# Patient Record
Sex: Female | Born: 1945 | Race: White | Hispanic: No | Smoking: Current every day smoker
Health system: Southern US, Community
[De-identification: ages and names within clinical notes are randomized; demographics above are authoritative.]

## PROBLEM LIST (undated history)

## (undated) DIAGNOSIS — D473 Essential (hemorrhagic) thrombocythemia: Secondary | ICD-10-CM

## (undated) DIAGNOSIS — Z1231 Encounter for screening mammogram for malignant neoplasm of breast: Principal | ICD-10-CM

## (undated) DIAGNOSIS — D759 Disease of blood and blood-forming organs, unspecified: Secondary | ICD-10-CM

## (undated) HISTORY — DX: Encounter for screening mammogram for malignant neoplasm of breast: Z12.31

## (undated) HISTORY — DX: Essential (hemorrhagic) thrombocythemia: D47.3

## (undated) HISTORY — PX: BREAST EXCISIONAL BIOPSY: SUR124

---

## 2003-12-16 ENCOUNTER — Ambulatory Visit: Payer: Self-pay | Admitting: Internal Medicine

## 2004-01-16 ENCOUNTER — Ambulatory Visit: Payer: Self-pay | Admitting: Internal Medicine

## 2004-01-26 ENCOUNTER — Ambulatory Visit: Payer: Self-pay | Admitting: Family Medicine

## 2004-02-15 ENCOUNTER — Ambulatory Visit: Payer: Self-pay | Admitting: Internal Medicine

## 2004-03-17 ENCOUNTER — Ambulatory Visit: Payer: Self-pay | Admitting: Internal Medicine

## 2004-04-17 ENCOUNTER — Ambulatory Visit: Payer: Self-pay | Admitting: Internal Medicine

## 2004-06-05 ENCOUNTER — Ambulatory Visit: Payer: Self-pay | Admitting: Internal Medicine

## 2004-06-15 ENCOUNTER — Ambulatory Visit: Payer: Self-pay | Admitting: Internal Medicine

## 2004-07-18 ENCOUNTER — Ambulatory Visit: Payer: Self-pay | Admitting: Family Medicine

## 2004-07-31 ENCOUNTER — Ambulatory Visit: Payer: Self-pay | Admitting: Internal Medicine

## 2004-08-15 ENCOUNTER — Ambulatory Visit: Payer: Self-pay | Admitting: Internal Medicine

## 2004-09-25 ENCOUNTER — Ambulatory Visit: Payer: Self-pay | Admitting: Internal Medicine

## 2004-10-15 ENCOUNTER — Ambulatory Visit: Payer: Self-pay | Admitting: Internal Medicine

## 2004-11-20 ENCOUNTER — Ambulatory Visit: Payer: Self-pay | Admitting: Internal Medicine

## 2004-12-15 ENCOUNTER — Ambulatory Visit: Payer: Self-pay | Admitting: Internal Medicine

## 2005-01-15 ENCOUNTER — Ambulatory Visit: Payer: Self-pay | Admitting: Internal Medicine

## 2005-02-14 ENCOUNTER — Ambulatory Visit: Payer: Self-pay | Admitting: Internal Medicine

## 2005-03-20 ENCOUNTER — Ambulatory Visit: Payer: Self-pay | Admitting: Internal Medicine

## 2005-04-17 ENCOUNTER — Ambulatory Visit: Payer: Self-pay | Admitting: Internal Medicine

## 2005-05-15 ENCOUNTER — Ambulatory Visit: Payer: Self-pay | Admitting: Internal Medicine

## 2005-06-15 ENCOUNTER — Ambulatory Visit: Payer: Self-pay | Admitting: Internal Medicine

## 2005-07-15 ENCOUNTER — Ambulatory Visit: Payer: Self-pay | Admitting: Internal Medicine

## 2005-08-06 ENCOUNTER — Ambulatory Visit: Payer: Self-pay | Admitting: Unknown Physician Specialty

## 2005-09-03 ENCOUNTER — Ambulatory Visit: Payer: Self-pay | Admitting: Internal Medicine

## 2005-09-14 ENCOUNTER — Ambulatory Visit: Payer: Self-pay | Admitting: Internal Medicine

## 2005-11-06 ENCOUNTER — Ambulatory Visit: Payer: Self-pay | Admitting: Internal Medicine

## 2005-11-15 ENCOUNTER — Ambulatory Visit: Payer: Self-pay | Admitting: Internal Medicine

## 2006-01-08 ENCOUNTER — Ambulatory Visit: Payer: Self-pay | Admitting: Internal Medicine

## 2006-01-15 ENCOUNTER — Ambulatory Visit: Payer: Self-pay | Admitting: Internal Medicine

## 2006-03-05 ENCOUNTER — Ambulatory Visit: Payer: Self-pay | Admitting: Internal Medicine

## 2006-03-17 ENCOUNTER — Ambulatory Visit: Payer: Self-pay | Admitting: Internal Medicine

## 2006-05-07 ENCOUNTER — Ambulatory Visit: Payer: Self-pay | Admitting: Internal Medicine

## 2006-05-16 ENCOUNTER — Ambulatory Visit: Payer: Self-pay | Admitting: Internal Medicine

## 2006-07-09 ENCOUNTER — Ambulatory Visit: Payer: Self-pay | Admitting: Internal Medicine

## 2006-07-16 ENCOUNTER — Ambulatory Visit: Payer: Self-pay | Admitting: Internal Medicine

## 2006-09-03 ENCOUNTER — Ambulatory Visit: Payer: Self-pay | Admitting: Internal Medicine

## 2006-09-08 ENCOUNTER — Ambulatory Visit: Payer: Self-pay | Admitting: Unknown Physician Specialty

## 2006-09-15 ENCOUNTER — Ambulatory Visit: Payer: Self-pay | Admitting: Internal Medicine

## 2006-10-16 ENCOUNTER — Ambulatory Visit: Payer: Self-pay | Admitting: Internal Medicine

## 2006-11-16 ENCOUNTER — Ambulatory Visit: Payer: Self-pay | Admitting: Internal Medicine

## 2006-12-16 ENCOUNTER — Ambulatory Visit: Payer: Self-pay | Admitting: Internal Medicine

## 2007-01-16 ENCOUNTER — Ambulatory Visit: Payer: Self-pay | Admitting: Internal Medicine

## 2007-03-04 ENCOUNTER — Ambulatory Visit: Payer: Self-pay | Admitting: Oncology

## 2007-03-18 ENCOUNTER — Ambulatory Visit: Payer: Self-pay | Admitting: Oncology

## 2007-04-18 ENCOUNTER — Ambulatory Visit: Payer: Self-pay | Admitting: Internal Medicine

## 2007-05-03 ENCOUNTER — Ambulatory Visit: Payer: Self-pay | Admitting: Internal Medicine

## 2007-05-16 ENCOUNTER — Ambulatory Visit: Payer: Self-pay | Admitting: Internal Medicine

## 2007-06-16 ENCOUNTER — Ambulatory Visit: Payer: Self-pay | Admitting: Internal Medicine

## 2007-07-16 ENCOUNTER — Ambulatory Visit: Payer: Self-pay | Admitting: Internal Medicine

## 2007-09-07 ENCOUNTER — Ambulatory Visit: Payer: Self-pay | Admitting: Internal Medicine

## 2007-09-15 ENCOUNTER — Ambulatory Visit: Payer: Self-pay | Admitting: Internal Medicine

## 2007-10-05 ENCOUNTER — Ambulatory Visit: Payer: Self-pay | Admitting: Unknown Physician Specialty

## 2007-10-16 ENCOUNTER — Ambulatory Visit: Payer: Self-pay | Admitting: Internal Medicine

## 2007-11-09 ENCOUNTER — Ambulatory Visit: Payer: Self-pay | Admitting: Internal Medicine

## 2007-11-16 ENCOUNTER — Ambulatory Visit: Payer: Self-pay | Admitting: Internal Medicine

## 2007-12-16 ENCOUNTER — Ambulatory Visit: Payer: Self-pay | Admitting: Internal Medicine

## 2008-01-16 ENCOUNTER — Ambulatory Visit: Payer: Self-pay | Admitting: Internal Medicine

## 2008-03-13 ENCOUNTER — Ambulatory Visit: Payer: Self-pay | Admitting: Internal Medicine

## 2008-03-17 ENCOUNTER — Ambulatory Visit: Payer: Self-pay | Admitting: Internal Medicine

## 2008-04-17 ENCOUNTER — Ambulatory Visit: Payer: Self-pay | Admitting: Internal Medicine

## 2008-05-15 ENCOUNTER — Ambulatory Visit: Payer: Self-pay | Admitting: Internal Medicine

## 2008-05-18 ENCOUNTER — Ambulatory Visit: Payer: Self-pay | Admitting: Internal Medicine

## 2008-06-15 ENCOUNTER — Ambulatory Visit: Payer: Self-pay | Admitting: Internal Medicine

## 2008-07-18 ENCOUNTER — Ambulatory Visit: Payer: Self-pay | Admitting: Internal Medicine

## 2008-08-15 ENCOUNTER — Ambulatory Visit: Payer: Self-pay | Admitting: Internal Medicine

## 2008-09-19 ENCOUNTER — Ambulatory Visit: Payer: Self-pay | Admitting: Internal Medicine

## 2008-10-15 ENCOUNTER — Ambulatory Visit: Payer: Self-pay | Admitting: Internal Medicine

## 2008-10-24 ENCOUNTER — Ambulatory Visit: Payer: Self-pay | Admitting: Unknown Physician Specialty

## 2008-10-25 ENCOUNTER — Ambulatory Visit: Payer: Self-pay

## 2008-11-15 ENCOUNTER — Ambulatory Visit: Payer: Self-pay | Admitting: Internal Medicine

## 2008-11-21 ENCOUNTER — Ambulatory Visit: Payer: Self-pay | Admitting: Internal Medicine

## 2008-12-15 ENCOUNTER — Ambulatory Visit: Payer: Self-pay | Admitting: Internal Medicine

## 2009-01-22 ENCOUNTER — Ambulatory Visit: Payer: Self-pay | Admitting: Internal Medicine

## 2009-02-14 ENCOUNTER — Ambulatory Visit: Payer: Self-pay | Admitting: Internal Medicine

## 2009-04-03 ENCOUNTER — Ambulatory Visit: Payer: Self-pay | Admitting: Internal Medicine

## 2009-04-17 ENCOUNTER — Ambulatory Visit: Payer: Self-pay | Admitting: Internal Medicine

## 2009-05-15 ENCOUNTER — Ambulatory Visit: Payer: Self-pay | Admitting: Internal Medicine

## 2009-05-28 ENCOUNTER — Ambulatory Visit: Payer: Self-pay | Admitting: Internal Medicine

## 2009-06-15 ENCOUNTER — Ambulatory Visit: Payer: Self-pay | Admitting: Internal Medicine

## 2009-08-28 ENCOUNTER — Ambulatory Visit: Payer: Self-pay | Admitting: Internal Medicine

## 2009-09-14 ENCOUNTER — Ambulatory Visit: Payer: Self-pay | Admitting: Internal Medicine

## 2009-11-15 ENCOUNTER — Ambulatory Visit: Payer: Self-pay | Admitting: Internal Medicine

## 2009-11-29 ENCOUNTER — Ambulatory Visit: Payer: Self-pay | Admitting: Internal Medicine

## 2009-12-15 ENCOUNTER — Ambulatory Visit: Payer: Self-pay | Admitting: Internal Medicine

## 2010-01-15 ENCOUNTER — Ambulatory Visit: Payer: Self-pay | Admitting: Internal Medicine

## 2010-03-01 ENCOUNTER — Ambulatory Visit: Payer: Self-pay | Admitting: Internal Medicine

## 2010-03-17 ENCOUNTER — Ambulatory Visit: Payer: Self-pay | Admitting: Internal Medicine

## 2010-05-31 ENCOUNTER — Ambulatory Visit: Payer: Self-pay | Admitting: Internal Medicine

## 2010-06-16 ENCOUNTER — Ambulatory Visit: Payer: Self-pay | Admitting: Internal Medicine

## 2010-09-02 ENCOUNTER — Ambulatory Visit: Payer: Self-pay | Admitting: Internal Medicine

## 2010-09-15 ENCOUNTER — Ambulatory Visit: Payer: Self-pay | Admitting: Internal Medicine

## 2010-12-02 ENCOUNTER — Ambulatory Visit: Payer: Self-pay | Admitting: Internal Medicine

## 2010-12-16 ENCOUNTER — Ambulatory Visit: Payer: Self-pay | Admitting: Internal Medicine

## 2011-02-03 ENCOUNTER — Ambulatory Visit: Payer: Self-pay | Admitting: Internal Medicine

## 2011-02-10 ENCOUNTER — Ambulatory Visit: Payer: Self-pay | Admitting: Internal Medicine

## 2011-02-15 ENCOUNTER — Ambulatory Visit: Payer: Self-pay | Admitting: Internal Medicine

## 2011-05-21 ENCOUNTER — Ambulatory Visit: Payer: Self-pay | Admitting: Internal Medicine

## 2011-05-21 LAB — CBC CANCER CENTER
Basophil #: 0.1 x10 3/mm (ref 0.0–0.1)
Eosinophil #: 0.1 x10 3/mm (ref 0.0–0.7)
HCT: 39.8 % (ref 35.0–47.0)
Lymphocyte #: 1.6 x10 3/mm (ref 1.0–3.6)
MCH: 36.1 pg — ABNORMAL HIGH (ref 26.0–34.0)
MCV: 105 fL — ABNORMAL HIGH (ref 80–100)
Monocyte #: 0.8 x10 3/mm — ABNORMAL HIGH (ref 0.0–0.7)
Monocyte %: 9.8 %
Neutrophil #: 5.9 x10 3/mm (ref 1.4–6.5)
Platelet: 325 x10 3/mm (ref 150–440)
RDW: 12.8 % (ref 11.5–14.5)
WBC: 8.4 x10 3/mm (ref 3.6–11.0)

## 2011-06-16 ENCOUNTER — Ambulatory Visit: Payer: Self-pay | Admitting: Internal Medicine

## 2011-08-21 ENCOUNTER — Ambulatory Visit: Payer: Self-pay | Admitting: Internal Medicine

## 2011-08-21 LAB — CBC CANCER CENTER
Basophil #: 0.1 x10 3/mm (ref 0.0–0.1)
Basophil %: 1 %
Eosinophil #: 0.1 x10 3/mm (ref 0.0–0.7)
HCT: 40.7 % (ref 35.0–47.0)
HGB: 13.7 g/dL (ref 12.0–16.0)
Lymphocyte %: 22.8 %
MCH: 35.8 pg — ABNORMAL HIGH (ref 26.0–34.0)
MCHC: 33.7 g/dL (ref 32.0–36.0)
MCV: 106 fL — ABNORMAL HIGH (ref 80–100)
Monocyte #: 0.7 x10 3/mm (ref 0.2–0.9)
Monocyte %: 9.3 %
Neutrophil #: 4.8 x10 3/mm (ref 1.4–6.5)
Platelet: 302 x10 3/mm (ref 150–440)
RBC: 3.84 10*6/uL (ref 3.80–5.20)
RDW: 13.4 % (ref 11.5–14.5)
WBC: 7.4 x10 3/mm (ref 3.6–11.0)

## 2011-09-08 LAB — CBC CANCER CENTER
Basophil #: 0.1 x10 3/mm (ref 0.0–0.1)
Eosinophil %: 1.2 %
HCT: 40.5 % (ref 35.0–47.0)
Lymphocyte %: 19.8 %
MCH: 35.5 pg — ABNORMAL HIGH (ref 26.0–34.0)
MCHC: 33.5 g/dL (ref 32.0–36.0)
MCV: 106 fL — ABNORMAL HIGH (ref 80–100)
Monocyte #: 0.8 x10 3/mm (ref 0.2–0.9)
Neutrophil %: 67.3 %
RBC: 3.82 10*6/uL (ref 3.80–5.20)
WBC: 7.7 x10 3/mm (ref 3.6–11.0)

## 2011-09-15 ENCOUNTER — Ambulatory Visit: Payer: Self-pay | Admitting: Internal Medicine

## 2011-09-25 LAB — CBC CANCER CENTER
Basophil #: 0.1 x10 3/mm (ref 0.0–0.1)
Eosinophil #: 0.2 x10 3/mm (ref 0.0–0.7)
Eosinophil %: 2 %
Lymphocyte #: 1.9 x10 3/mm (ref 1.0–3.6)
Lymphocyte %: 24.5 %
MCV: 106 fL — ABNORMAL HIGH (ref 80–100)
Monocyte #: 0.9 x10 3/mm (ref 0.2–0.9)
Monocyte %: 11.1 %
Neutrophil %: 61.3 %
Platelet: 300 x10 3/mm (ref 150–440)
RBC: 3.78 10*6/uL — ABNORMAL LOW (ref 3.80–5.20)
RDW: 12.7 % (ref 11.5–14.5)
WBC: 7.7 x10 3/mm (ref 3.6–11.0)

## 2011-10-16 ENCOUNTER — Ambulatory Visit: Payer: Self-pay | Admitting: Internal Medicine

## 2011-10-16 LAB — CBC CANCER CENTER
Basophil %: 1 %
Eosinophil %: 1.9 %
HCT: 39.3 % (ref 35.0–47.0)
HGB: 13.7 g/dL (ref 12.0–16.0)
Lymphocyte %: 24.7 %
MCH: 36.9 pg — ABNORMAL HIGH (ref 26.0–34.0)
MCHC: 34.8 g/dL (ref 32.0–36.0)
Monocyte #: 0.7 x10 3/mm (ref 0.2–0.9)
Monocyte %: 9 %
Neutrophil %: 63.4 %
Platelet: 321 x10 3/mm (ref 150–440)

## 2011-11-16 ENCOUNTER — Ambulatory Visit: Payer: Self-pay | Admitting: Internal Medicine

## 2011-11-20 LAB — CBC CANCER CENTER
Eosinophil #: 0.1 x10 3/mm (ref 0.0–0.7)
HCT: 41.1 % (ref 35.0–47.0)
Lymphocyte %: 20 %
MCH: 35.4 pg — ABNORMAL HIGH (ref 26.0–34.0)
Monocyte %: 9.6 %
Neutrophil %: 68.1 %
Platelet: 329 x10 3/mm (ref 150–440)
RDW: 12.6 % (ref 11.5–14.5)
WBC: 8.3 x10 3/mm (ref 3.6–11.0)

## 2011-12-16 ENCOUNTER — Ambulatory Visit: Payer: Self-pay | Admitting: Internal Medicine

## 2012-02-04 ENCOUNTER — Ambulatory Visit: Payer: Self-pay | Admitting: Internal Medicine

## 2012-02-19 ENCOUNTER — Ambulatory Visit: Payer: Self-pay | Admitting: Internal Medicine

## 2012-02-19 LAB — CBC CANCER CENTER
Basophil %: 0.9 %
Eosinophil #: 0.1 x10 3/mm (ref 0.0–0.7)
Eosinophil %: 0.7 %
HGB: 13.9 g/dL (ref 12.0–16.0)
Lymphocyte #: 1.6 x10 3/mm (ref 1.0–3.6)
MCH: 35.6 pg — ABNORMAL HIGH (ref 26.0–34.0)
MCV: 102 fL — ABNORMAL HIGH (ref 80–100)
Monocyte #: 1.1 x10 3/mm — ABNORMAL HIGH (ref 0.2–0.9)
Monocyte %: 11 %
Neutrophil %: 72.2 %
Platelet: 338 x10 3/mm (ref 150–440)
RBC: 3.91 10*6/uL (ref 3.80–5.20)
WBC: 10.4 x10 3/mm (ref 3.6–11.0)

## 2012-03-17 ENCOUNTER — Ambulatory Visit: Payer: Self-pay | Admitting: Internal Medicine

## 2012-03-18 LAB — CBC CANCER CENTER
Basophil %: 0.9 %
Eosinophil %: 1.4 %
HGB: 14 g/dL (ref 12.0–16.0)
Lymphocyte #: 1.5 x10 3/mm (ref 1.0–3.6)
MCH: 34.7 pg — ABNORMAL HIGH (ref 26.0–34.0)
MCHC: 34.1 g/dL (ref 32.0–36.0)
MCV: 102 fL — ABNORMAL HIGH (ref 80–100)
Monocyte %: 10.3 %
Neutrophil #: 5.1 x10 3/mm (ref 1.4–6.5)
Neutrophil %: 67.9 %
Platelet: 291 x10 3/mm (ref 150–440)
RDW: 12.9 % (ref 11.5–14.5)

## 2012-04-17 ENCOUNTER — Ambulatory Visit: Payer: Self-pay | Admitting: Internal Medicine

## 2012-06-15 ENCOUNTER — Ambulatory Visit: Payer: Self-pay | Admitting: Internal Medicine

## 2012-06-17 LAB — CBC CANCER CENTER
Basophil #: 0.1 x10 3/mm (ref 0.0–0.1)
Eosinophil %: 1.6 %
HGB: 14 g/dL (ref 12.0–16.0)
Lymphocyte #: 1.8 x10 3/mm (ref 1.0–3.6)
Lymphocyte %: 22.1 %
MCH: 34 pg (ref 26.0–34.0)
MCHC: 33.6 g/dL (ref 32.0–36.0)
MCV: 101 fL — ABNORMAL HIGH (ref 80–100)
Monocyte #: 0.8 x10 3/mm (ref 0.2–0.9)
Monocyte %: 10.2 %
Neutrophil #: 5.4 x10 3/mm (ref 1.4–6.5)
Neutrophil %: 65 %
RBC: 4.12 10*6/uL (ref 3.80–5.20)
RDW: 13.1 % (ref 11.5–14.5)
WBC: 8.3 x10 3/mm (ref 3.6–11.0)

## 2012-07-15 ENCOUNTER — Ambulatory Visit: Payer: Self-pay | Admitting: Internal Medicine

## 2012-09-14 ENCOUNTER — Ambulatory Visit: Payer: Self-pay | Admitting: Internal Medicine

## 2012-09-16 LAB — CBC CANCER CENTER
Basophil #: 0.1 x10 3/mm (ref 0.0–0.1)
Basophil %: 1 %
Eosinophil #: 0.1 x10 3/mm (ref 0.0–0.7)
HCT: 39.3 % (ref 35.0–47.0)
HGB: 13.7 g/dL (ref 12.0–16.0)
Lymphocyte %: 18.3 %
MCH: 35.3 pg — ABNORMAL HIGH (ref 26.0–34.0)
MCHC: 35 g/dL (ref 32.0–36.0)
MCV: 101 fL — ABNORMAL HIGH (ref 80–100)
Monocyte #: 0.9 x10 3/mm (ref 0.2–0.9)
Neutrophil #: 6.9 x10 3/mm — ABNORMAL HIGH (ref 1.4–6.5)
Platelet: 309 x10 3/mm (ref 150–440)
RDW: 13.2 % (ref 11.5–14.5)
WBC: 9.9 x10 3/mm (ref 3.6–11.0)

## 2012-10-15 ENCOUNTER — Ambulatory Visit: Payer: Self-pay | Admitting: Internal Medicine

## 2012-12-31 ENCOUNTER — Ambulatory Visit: Payer: Self-pay | Admitting: Internal Medicine

## 2012-12-31 LAB — CBC CANCER CENTER
Eosinophil #: 0.1 x10 3/mm (ref 0.0–0.7)
Eosinophil %: 1.3 %
HCT: 40.9 % (ref 35.0–47.0)
HGB: 14.1 g/dL (ref 12.0–16.0)
MCH: 35.5 pg — ABNORMAL HIGH (ref 26.0–34.0)
Monocyte #: 0.9 x10 3/mm (ref 0.2–0.9)
Neutrophil #: 6.7 x10 3/mm — ABNORMAL HIGH (ref 1.4–6.5)
Platelet: 354 x10 3/mm (ref 150–440)
RBC: 3.97 10*6/uL (ref 3.80–5.20)

## 2013-01-15 ENCOUNTER — Ambulatory Visit: Payer: Self-pay | Admitting: Internal Medicine

## 2013-02-14 ENCOUNTER — Ambulatory Visit: Payer: Self-pay | Admitting: Internal Medicine

## 2013-04-01 ENCOUNTER — Ambulatory Visit: Payer: Self-pay | Admitting: Internal Medicine

## 2013-04-01 LAB — CBC CANCER CENTER
BASOS PCT: 0.8 %
Basophil #: 0.1 x10 3/mm (ref 0.0–0.1)
EOS ABS: 0.1 x10 3/mm (ref 0.0–0.7)
Eosinophil %: 1.4 %
HCT: 42.8 % (ref 35.0–47.0)
HGB: 14.4 g/dL (ref 12.0–16.0)
LYMPHS ABS: 1.9 x10 3/mm (ref 1.0–3.6)
Lymphocyte %: 21.6 %
MCH: 34.3 pg — ABNORMAL HIGH (ref 26.0–34.0)
MCHC: 33.7 g/dL (ref 32.0–36.0)
MCV: 102 fL — AB (ref 80–100)
MONO ABS: 0.8 x10 3/mm (ref 0.2–0.9)
Monocyte %: 9.7 %
Neutrophil #: 5.7 x10 3/mm (ref 1.4–6.5)
Neutrophil %: 66.5 %
Platelet: 357 x10 3/mm (ref 150–440)
RBC: 4.2 10*6/uL (ref 3.80–5.20)
RDW: 13 % (ref 11.5–14.5)
WBC: 8.6 x10 3/mm (ref 3.6–11.0)

## 2013-04-17 ENCOUNTER — Ambulatory Visit: Payer: Self-pay | Admitting: Internal Medicine

## 2013-04-29 LAB — CBC CANCER CENTER
BASOS PCT: 0.8 %
Basophil #: 0.1 x10 3/mm (ref 0.0–0.1)
Eosinophil #: 0.1 x10 3/mm (ref 0.0–0.7)
Eosinophil %: 1.1 %
HCT: 43.5 % (ref 35.0–47.0)
HGB: 14.4 g/dL (ref 12.0–16.0)
Lymphocyte #: 1.9 x10 3/mm (ref 1.0–3.6)
Lymphocyte %: 18.4 %
MCH: 33.5 pg (ref 26.0–34.0)
MCHC: 33.1 g/dL (ref 32.0–36.0)
MCV: 101 fL — ABNORMAL HIGH (ref 80–100)
Monocyte #: 0.9 x10 3/mm (ref 0.2–0.9)
Monocyte %: 9 %
NEUTROS ABS: 7.1 x10 3/mm — AB (ref 1.4–6.5)
Neutrophil %: 70.7 %
Platelet: 329 x10 3/mm (ref 150–440)
RBC: 4.3 10*6/uL (ref 3.80–5.20)
RDW: 12.8 % (ref 11.5–14.5)
WBC: 10.1 x10 3/mm (ref 3.6–11.0)

## 2013-05-15 ENCOUNTER — Ambulatory Visit: Payer: Self-pay | Admitting: Internal Medicine

## 2013-06-01 LAB — CBC CANCER CENTER
Basophil #: 0.1 x10 3/mm (ref 0.0–0.1)
Basophil %: 1 %
EOS PCT: 1 %
Eosinophil #: 0.1 x10 3/mm (ref 0.0–0.7)
HCT: 40.5 % (ref 35.0–47.0)
HGB: 13.6 g/dL (ref 12.0–16.0)
LYMPHS ABS: 1.9 x10 3/mm (ref 1.0–3.6)
Lymphocyte %: 23.1 %
MCH: 34.1 pg — AB (ref 26.0–34.0)
MCHC: 33.6 g/dL (ref 32.0–36.0)
MCV: 102 fL — ABNORMAL HIGH (ref 80–100)
MONO ABS: 0.7 x10 3/mm (ref 0.2–0.9)
MONOS PCT: 8.5 %
Neutrophil #: 5.4 x10 3/mm (ref 1.4–6.5)
Neutrophil %: 66.4 %
Platelet: 318 x10 3/mm (ref 150–440)
RBC: 3.98 10*6/uL (ref 3.80–5.20)
RDW: 12.9 % (ref 11.5–14.5)
WBC: 8.1 x10 3/mm (ref 3.6–11.0)

## 2013-06-15 ENCOUNTER — Ambulatory Visit: Payer: Self-pay | Admitting: Internal Medicine

## 2013-07-28 ENCOUNTER — Ambulatory Visit: Payer: Self-pay | Admitting: Internal Medicine

## 2013-07-29 LAB — CBC CANCER CENTER
Basophil #: 0.2 "x10 3/mm " — ABNORMAL HIGH
Basophil %: 1.9 %
Eosinophil #: 0.1 "x10 3/mm "
Eosinophil %: 1.2 %
HCT: 41.2 %
HGB: 14.1 g/dL
Lymphocyte %: 18.7 %
Lymphs Abs: 1.8 "x10 3/mm "
MCH: 34.3 pg — ABNORMAL HIGH
MCHC: 34.2 g/dL
MCV: 100 fL
Monocyte #: 0.8 "x10 3/mm "
Monocyte %: 8.3 %
Neutrophil #: 6.9 "x10 3/mm " — ABNORMAL HIGH
Neutrophil %: 69.9 %
Platelet: 326 "x10 3/mm "
RBC: 4.12 "x10 6/mm "
RDW: 12.7 %
WBC: 9.9 "x10 3/mm "

## 2013-08-15 ENCOUNTER — Ambulatory Visit: Payer: Self-pay | Admitting: Internal Medicine

## 2013-10-28 ENCOUNTER — Ambulatory Visit: Payer: Self-pay | Admitting: Internal Medicine

## 2013-10-28 LAB — CBC CANCER CENTER
BASOS ABS: 0.1 x10 3/mm (ref 0.0–0.1)
Basophil %: 1.1 %
EOS ABS: 0.1 x10 3/mm (ref 0.0–0.7)
Eosinophil %: 1.2 %
HCT: 40.5 % (ref 35.0–47.0)
HGB: 13.7 g/dL (ref 12.0–16.0)
LYMPHS ABS: 1.6 x10 3/mm (ref 1.0–3.6)
LYMPHS PCT: 18 %
MCH: 34.9 pg — AB (ref 26.0–34.0)
MCHC: 33.8 g/dL (ref 32.0–36.0)
MCV: 103 fL — AB (ref 80–100)
MONO ABS: 0.7 x10 3/mm (ref 0.2–0.9)
Monocyte %: 8.3 %
Neutrophil #: 6.2 x10 3/mm (ref 1.4–6.5)
Neutrophil %: 71.4 %
Platelet: 324 x10 3/mm (ref 150–440)
RBC: 3.92 10*6/uL (ref 3.80–5.20)
RDW: 12.7 % (ref 11.5–14.5)
WBC: 8.6 x10 3/mm (ref 3.6–11.0)

## 2013-11-15 ENCOUNTER — Ambulatory Visit: Payer: Self-pay | Admitting: Internal Medicine

## 2014-01-27 ENCOUNTER — Ambulatory Visit: Payer: Self-pay | Admitting: Internal Medicine

## 2014-01-27 LAB — CBC CANCER CENTER
Basophil #: 0.1 x10 3/mm (ref 0.0–0.1)
Basophil %: 0.8 %
Eosinophil #: 0.1 x10 3/mm (ref 0.0–0.7)
Eosinophil %: 1.1 %
HCT: 39.5 % (ref 35.0–47.0)
HGB: 13.5 g/dL (ref 12.0–16.0)
Lymphocyte #: 1.8 x10 3/mm (ref 1.0–3.6)
Lymphocyte %: 19.7 %
MCH: 35 pg — ABNORMAL HIGH (ref 26.0–34.0)
MCHC: 34.3 g/dL (ref 32.0–36.0)
MCV: 102 fL — ABNORMAL HIGH (ref 80–100)
Monocyte #: 0.8 x10 3/mm (ref 0.2–0.9)
Monocyte %: 8.6 %
Neutrophil #: 6.3 x10 3/mm (ref 1.4–6.5)
Neutrophil %: 69.8 %
PLATELETS: 322 x10 3/mm (ref 150–440)
RBC: 3.88 10*6/uL (ref 3.80–5.20)
RDW: 12.7 % (ref 11.5–14.5)
WBC: 9.1 x10 3/mm (ref 3.6–11.0)

## 2014-02-06 ENCOUNTER — Ambulatory Visit: Payer: Self-pay | Admitting: Internal Medicine

## 2014-02-14 ENCOUNTER — Ambulatory Visit: Payer: Self-pay | Admitting: Internal Medicine

## 2014-05-29 ENCOUNTER — Ambulatory Visit
Admit: 2014-05-29 | Disposition: A | Discharge status: Home / Self Care | Payer: Self-pay | Attending: Internal Medicine | Admitting: Internal Medicine

## 2014-06-16 ENCOUNTER — Ambulatory Visit
Admit: 2014-06-16 | Disposition: A | Discharge status: Home / Self Care | Payer: Self-pay | Attending: Internal Medicine | Admitting: Internal Medicine

## 2014-07-07 ENCOUNTER — Other Ambulatory Visit: Payer: Self-pay | Admitting: Internal Medicine

## 2014-09-28 ENCOUNTER — Other Ambulatory Visit: Payer: Self-pay | Admitting: *Deleted

## 2014-09-28 ENCOUNTER — Inpatient Hospital Stay: Payer: Medicare Other | Attending: Family Medicine

## 2014-09-28 DIAGNOSIS — D473 Essential (hemorrhagic) thrombocythemia: Secondary | ICD-10-CM

## 2014-09-28 LAB — CBC WITH DIFFERENTIAL/PLATELET
Basophils Absolute: 0.1 10*3/uL (ref 0–0.1)
Basophils Relative: 1 %
Eosinophils Absolute: 0.1 10*3/uL (ref 0–0.7)
Eosinophils Relative: 1 %
HCT: 40.5 % (ref 35.0–47.0)
Hemoglobin: 13.6 g/dL (ref 12.0–16.0)
LYMPHS PCT: 22 %
Lymphs Abs: 1.7 10*3/uL (ref 1.0–3.6)
MCH: 34.4 pg — ABNORMAL HIGH (ref 26.0–34.0)
MCHC: 33.7 g/dL (ref 32.0–36.0)
MCV: 102.1 fL — AB (ref 80.0–100.0)
MONOS PCT: 8 %
Monocytes Absolute: 0.6 10*3/uL (ref 0.2–0.9)
NEUTROS ABS: 5.3 10*3/uL (ref 1.4–6.5)
Neutrophils Relative %: 68 %
PLATELETS: 321 10*3/uL (ref 150–440)
RBC: 3.97 MIL/uL (ref 3.80–5.20)
RDW: 13 % (ref 11.5–14.5)
WBC: 7.7 10*3/uL (ref 3.6–11.0)

## 2014-10-06 ENCOUNTER — Other Ambulatory Visit: Payer: Self-pay | Admitting: Family Medicine

## 2014-10-06 ENCOUNTER — Telehealth: Payer: Self-pay | Admitting: *Deleted

## 2014-10-06 ENCOUNTER — Encounter: Payer: Self-pay | Admitting: Family Medicine

## 2014-10-06 DIAGNOSIS — Z1231 Encounter for screening mammogram for malignant neoplasm of breast: Secondary | ICD-10-CM

## 2014-10-06 HISTORY — DX: Encounter for screening mammogram for malignant neoplasm of breast: Z12.31

## 2014-10-06 NOTE — Telephone Encounter (Signed)
Patient requesting refill for Hydroxyurea 500 mg.

## 2014-10-06 NOTE — Telephone Encounter (Addendum)
Patient wants to know if prescription for Hydroxyurea was sent to pharmacy.  She is almost out. Refills for Hydroxyurea 500 mg capsules; with pt to take 1 po qd called in to [facility].Marland KitchenMarland Kitchen

## 2014-10-09 ENCOUNTER — Other Ambulatory Visit: Payer: Self-pay | Admitting: *Deleted

## 2014-10-09 DIAGNOSIS — Z1231 Encounter for screening mammogram for malignant neoplasm of breast: Secondary | ICD-10-CM

## 2015-01-29 ENCOUNTER — Other Ambulatory Visit: Payer: Self-pay

## 2015-01-29 ENCOUNTER — Inpatient Hospital Stay: Payer: Medicare Other

## 2015-01-29 ENCOUNTER — Inpatient Hospital Stay: Payer: Medicare Other | Attending: Family Medicine | Admitting: Internal Medicine

## 2015-01-29 ENCOUNTER — Encounter: Payer: Self-pay | Admitting: Internal Medicine

## 2015-01-29 ENCOUNTER — Ambulatory Visit: Payer: Self-pay | Admitting: Internal Medicine

## 2015-01-29 ENCOUNTER — Other Ambulatory Visit: Payer: Self-pay | Admitting: Family Medicine

## 2015-01-29 ENCOUNTER — Encounter: Payer: Self-pay | Admitting: Family Medicine

## 2015-01-29 VITALS — BP 122/79 | HR 106 | Temp 97.6°F | Resp 18 | Ht 67.0 in | Wt 114.4 lb

## 2015-01-29 DIAGNOSIS — F17210 Nicotine dependence, cigarettes, uncomplicated: Secondary | ICD-10-CM | POA: Diagnosis not present

## 2015-01-29 DIAGNOSIS — K047 Periapical abscess without sinus: Secondary | ICD-10-CM | POA: Insufficient documentation

## 2015-01-29 DIAGNOSIS — D75839 Thrombocytosis, unspecified: Secondary | ICD-10-CM

## 2015-01-29 DIAGNOSIS — D473 Essential (hemorrhagic) thrombocythemia: Secondary | ICD-10-CM | POA: Insufficient documentation

## 2015-01-29 DIAGNOSIS — Z79899 Other long term (current) drug therapy: Secondary | ICD-10-CM | POA: Diagnosis not present

## 2015-01-29 DIAGNOSIS — Z888 Allergy status to other drugs, medicaments and biological substances status: Secondary | ICD-10-CM | POA: Diagnosis not present

## 2015-01-29 HISTORY — DX: Thrombocytosis, unspecified: D75.839

## 2015-01-29 LAB — CBC WITH DIFFERENTIAL/PLATELET
BASOS ABS: 0.1 10*3/uL (ref 0–0.1)
BASOS PCT: 1 %
Eosinophils Absolute: 0.1 10*3/uL (ref 0–0.7)
Eosinophils Relative: 1 %
HCT: 39.6 % (ref 35.0–47.0)
Hemoglobin: 13.6 g/dL (ref 12.0–16.0)
LYMPHS PCT: 12 %
Lymphs Abs: 1.2 10*3/uL (ref 1.0–3.6)
MCH: 34.8 pg — ABNORMAL HIGH (ref 26.0–34.0)
MCHC: 34.2 g/dL (ref 32.0–36.0)
MCV: 101.7 fL — AB (ref 80.0–100.0)
MONO ABS: 0.9 10*3/uL (ref 0.2–0.9)
Monocytes Relative: 9 %
NEUTROS ABS: 8 10*3/uL — AB (ref 1.4–6.5)
Neutrophils Relative %: 77 %
Platelets: 346 10*3/uL (ref 150–440)
RBC: 3.9 MIL/uL (ref 3.80–5.20)
RDW: 12.8 % (ref 11.5–14.5)
WBC: 10.3 10*3/uL (ref 3.6–11.0)

## 2015-01-29 NOTE — Progress Notes (Signed)
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 She is never had bone marrow biopsy to confirm the diagnosis, nor shoe was tested for Jak2V617F, calreticulin or MPL mutations.  She was treated with anagrelide for short periods of time, but developed multiple gastrointestinal complaints, headaches and palpitations. She has been on low dose hydroxyurea since approximately 2009. She has not taken low-dose aspirin due to history of epistaxis. No flowsheet data found.  INTERVAL HISTORY:   Since her last appointment Miss Skorupski has done fairly well clinically, tolerate can 500 mg of hydroxyurea at the well without any mouth ulcers, leg ulcers, or gastrointestinal complaints. She, however, developed multiple dental abscesses, and is ready to have several of her upper teeth pulled. She denies fevers, chills, night sweats, weight loss, nausea, vomiting, diarrhea, constipation, chest pain, shortness of breath. REVIEW OF SYSTEMS:   As per HPI. Otherwise, a complete review of systems is negatve.  PAST MEDICAL HISTORY: Past Medical History  Diagnosis Date  . Encounter for screening mammogram for breast cancer 10/06/2014  . Thrombocytosis ([place]) 01/29/2015    PAST SURGICAL HISTORY: No past surgical history on file.  FAMILY HISTORY Family History  Problem Relation Age of Onset  . Breast cancer Sister     ADVANCED DIRECTIVES:  No flowsheet data found.  HEALTH MAINTENANCE: Social History  Substance Use Topics  . Smoking status: Current Every Day Smoker -- 1.00 packs/day for 56 years  . Smokeless tobacco: Never Used  . Alcohol Use: No      Allergies not on file  Current Outpatient  Prescriptions  Medication Sig Dispense Refill  . b complex vitamins tablet Take 1 tablet by mouth daily.    . hydroxyurea (HYDREA) 500 MG capsule 1 capsule.     No current facility-administered medications for this visit.    OBJECTIVE: ECOG FS:0 - Asymptomatic  PHYSICAL EXAM: BP 122/79 mmHg  Pulse 106  Temp(Src) 97.6 F (36.4 C)  Resp 18  Ht 5' 7"  (1.702 m)  Wt 114 lb 6.7 oz (51.9 kg)  BMI 17.92 kg/m2  General Appearance:    Alert, cooperative, no distress, appears younger than stated age 69 female  Head:    Normocephalic, without obvious abnormality, atraumatic, partial dentures   Eyes:    PERRL, conjunctiva/corneas clear, EOM's intact, fundi    benign, both eyes  Ears:    Normal TM's and external ear canals, both ears  Nose:   Nares normal, septum midline, mucosa normal, no drainage    or sinus tenderness  Throat:   Lips, mucosa, and tongue normal; teeth and gums normal  Neck:   Supple, symmetrical, trachea midline, no adenopathy;    thyroid:  no enlargement/tenderness/nodules; no carotid   bruit or JVD  Back:     Symmetric, no curvature, ROM normal, no CVA tenderness  Lungs:     Clear to auscultation bilaterally, respirations unlabored  Chest Wall:    No tenderness or deformity   Heart:    Regular rate and rhythm, S1 and S2 normal, no murmur, rub   or gallop  Breast Exam:    No tenderness, masses, or nipple abnormality  Abdomen:     Soft, non-tender, bowel sounds active all four quadrants,  no masses, no organomegaly, no hepatosplenomegaly.   Genitalia:    Normal female without lesion, discharge or tenderness  Rectal:    Normal tone, normal prostate, no masses or tenderness;   guaiac negative stool  Extremities:   Extremities normal, atraumatic, no cyanosis or edema  Pulses:   2+ and symmetric all extremities  Skin:   Skin color, texture, turgor normal, no rashes or lesions  Lymph nodes:   Cervical, supraclavicular, and axillary nodes normal  Neurologic:    CNII-XII intact, normal strength, sensation and reflexes    throughout      LAB RESULTS:  Recent Results (from the past 2160 hour(s))  CBC with Differential/Platelet     Status: Abnormal   Collection Time: 01/29/15 10:08 AM  Result Value Ref Range   WBC 10.3 3.6 - 11.0 K/uL   RBC 3.90 3.80 - 5.20 MIL/uL   Hemoglobin 13.6 12.0 - 16.0 g/dL   HCT 39.6 35.0 - 47.0 %   MCV 101.7 (H) 80.0 - 100.0 fL   MCH 34.8 (H) 26.0 - 34.0 pg   MCHC 34.2 32.0 - 36.0 g/dL   RDW 12.8 11.5 - 14.5 %   Platelets 346 150 - 440 K/uL   Neutrophils Relative % 77 %   Neutro Abs 8.0 (H) 1.4 - 6.5 K/uL   Lymphocytes Relative 12 %   Lymphs Abs 1.2 1.0 - 3.6 K/uL   Monocytes Relative 9 %   Monocytes Absolute 0.9 0.2 - 0.9 K/uL   Eosinophils Relative 1 %   Eosinophils Absolute 0.1 0 - 0.7 K/uL   Basophils Relative 1 %   Basophils Absolute 0.1 0 - 0.1 K/uL    STUDIES: No results found.  ASSESSMENT AND PLAN: 1. Thrombocytosis-Mrs. 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 However, it appears that she has never been formally diagnosed with essential thrombocythemia, since no bone marrow biopsy was performed, and no mutational analysis was performed. Long-term smokers can also develop thrombocytosis as a reactive process, as such the diagnosis of essential thrombocythemia needs to be established appropriately, since it carries important implications, being a form of low-grade hematologic malignancy, which could lead to progression to acute myelogenous leukemia or transformation into myelofibrosis. Interestingly, despite having thrombocytosis for the past 15 years, Mrs. Orene Desanctis does not have any indication of progression of thrombocytosis to either AML or MF, and her platelet count is very well-controlled with a very low dose of hydroxyurea. We had a very long discussion  regarding the implications of having a diagnosis of high risk essential thrombocythemia, we agreed that we should perform additional workup, which should include Jak2V617F, calreticulin and MPL mutational analysis, with possibility of bone marrow biopsy which which also makes perfect so should she have blocked anywhere between cause if she is oh. 2. Smoking cessation- we discussed the necessity of smoking cessation as prophylaxis of cardiovascular diseases as well as lung, pancreas, bladder cancer. The patient declined a referral to smoking cessation program. We also discussed the importance of screening of lung cancer by means of performing CT of the chest without contrast, however, the patient declined such a referral as well. 3. Need for colonoscopy-patient has never had colonoscopy and is overdue for one. We discussed the necessity of colonoscopy as screening for colon cancer. The patient declined the referral. She also declined the offered fecal occult blood test. 4. Mammogram-the patient is  due for mammogram in November 2016. She will have a primary care physician arrange a mammogram for her.  Patient expressed understanding and was in agreement with this plan. She also understands that She can call clinic at any time with any questions, concerns, or complaints.    I spent 25 min discussing this case with more than 50% of the visit spent in counseling and coordination of care.   Roxana Hires, MD   01/29/2015 10:19 AM

## 2015-01-29 NOTE — Progress Notes (Signed)
She has abscess in her mouth on right side. She had impressions made for teeth and afterwards developed sore and swelling and some drainage and she has been using mouth rinse and seeing dentist today.  She has not had HA, clots, TIa or stroke since last visit

## 2015-02-12 ENCOUNTER — Ambulatory Visit
Admission: RE | Admit: 2015-02-12 | Discharge: 2015-02-12 | Disposition: A | Discharge status: Home / Self Care | Payer: Medicare Other | Source: Ambulatory Visit | Attending: Family Medicine | Admitting: Family Medicine

## 2015-02-12 ENCOUNTER — Other Ambulatory Visit: Payer: Self-pay | Admitting: Family Medicine

## 2015-02-12 DIAGNOSIS — Z1231 Encounter for screening mammogram for malignant neoplasm of breast: Secondary | ICD-10-CM | POA: Insufficient documentation

## 2015-02-13 LAB — CALR + MPL (REFLEXED)

## 2015-02-13 LAB — JAK2 W/REFLEX TO CALR/MPL

## 2015-02-19 ENCOUNTER — Other Ambulatory Visit: Payer: Self-pay | Admitting: Internal Medicine

## 2015-02-19 ENCOUNTER — Encounter: Payer: Self-pay | Admitting: Internal Medicine

## 2015-02-19 ENCOUNTER — Other Ambulatory Visit: Payer: Self-pay | Admitting: *Deleted

## 2015-02-19 ENCOUNTER — Inpatient Hospital Stay: Payer: Medicare Other | Attending: Internal Medicine | Admitting: Internal Medicine

## 2015-02-19 VITALS — BP 135/80 | HR 102 | Temp 97.1°F | Resp 20

## 2015-02-19 DIAGNOSIS — D75839 Thrombocytosis, unspecified: Secondary | ICD-10-CM

## 2015-02-19 DIAGNOSIS — D473 Essential (hemorrhagic) thrombocythemia: Secondary | ICD-10-CM | POA: Diagnosis not present

## 2015-02-19 DIAGNOSIS — Z79899 Other long term (current) drug therapy: Secondary | ICD-10-CM | POA: Diagnosis not present

## 2015-02-19 DIAGNOSIS — F17210 Nicotine dependence, cigarettes, uncomplicated: Secondary | ICD-10-CM | POA: Diagnosis not present

## 2015-02-19 DIAGNOSIS — D696 Thrombocytopenia, unspecified: Secondary | ICD-10-CM

## 2015-02-19 DIAGNOSIS — Z803 Family history of malignant neoplasm of breast: Secondary | ICD-10-CM | POA: Diagnosis not present

## 2015-02-19 NOTE — Progress Notes (Signed)
Pt here to get results of blood work.  Tooth better.

## 2015-02-19 NOTE — Progress Notes (Signed)
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 She is never had bone marrow biopsy to confirm the diagnosis, nor she was tested for Jak2V617F, calreticulin or MPL mutations. Testing performed in November 2016 was negative for all 3 mutations. She was treated with anagrelide for short periods of time, but developed multiple gastrointestinal complaints, headaches and palpitations. She has been on low dose hydroxyurea since approximately 2009. She has not taken low-dose aspirin due to history of epistaxis. No flowsheet data found.  INTERVAL HISTORY:   Since her last appointment Miss Schuld has done well, she had an appointment with dentist who performed clean off abscesses. She does not have any active complaints at this time. She denies fevers, chills, night sweats, weight loss, nausea, vomiting, diarrhea, constipation, chest pain, shortness of breath. REVIEW OF SYSTEMS:   As per HPI. Otherwise, a complete review of systems is negatve.  PAST MEDICAL HISTORY: Past Medical History  Diagnosis Date  . Encounter for screening mammogram for breast cancer 10/06/2014  . Thrombocytosis ([place]) 01/29/2015    PAST SURGICAL HISTORY: Past Surgical History  Procedure Laterality Date  . Breast biopsy Right 1979, 1973    neg    FAMILY HISTORY Family History  Problem Relation Age of Onset  . Breast cancer Sister 56    ADVANCED DIRECTIVES:  No flowsheet data found.  HEALTH MAINTENANCE: Social History  Substance Use Topics  . Smoking status: Current Every Day Smoker -- 1.00 packs/day for 56 years  . Smokeless tobacco: Never Used  . Alcohol Use: No      Allergies  Allergen Reactions  .  Penicillins Rash    Current Outpatient Prescriptions  Medication Sig Dispense Refill  . b complex vitamins tablet Take 1 tablet by mouth daily.    . hydroxyurea (HYDREA) 500 MG capsule 1 capsule.     No current facility-administered medications for this visit.    OBJECTIVE: ECOG FS:0 - Asymptomatic  PHYSICAL EXAM: BP 135/80 mmHg  Pulse 102  Temp(Src) 97.1 F (36.2 C) (Tympanic)  Resp 20  General Appearance:    Alert, cooperative, no distress, appears younger than stated age 69 female  Head:    Normocephalic, without obvious abnormality, atraumatic, partial dentures   Eyes:    PERRL, conjunctiva/corneas clear, EOM's intact, fundi    benign, both eyes  Ears:    Normal TM's and external ear canals, both ears  Nose:   Nares normal, septum midline, mucosa normal, no drainage    or sinus tenderness  Throat:   Lips, mucosa, and tongue normal; teeth and gums normal  Neck:   Supple, symmetrical, trachea midline, no adenopathy;    thyroid:  no enlargement/tenderness/nodules; no carotid   bruit or JVD  Back:     Symmetric, no curvature, ROM normal, no CVA tenderness  Lungs:     Clear to auscultation bilaterally, respirations unlabored  Chest Wall:    No tenderness or deformity   Heart:    Regular rate and rhythm, S1 and S2 normal, no murmur, rub   or gallop  Breast Exam:    No tenderness, masses, or nipple abnormality  Abdomen:     Soft, non-tender, bowel sounds active all four quadrants,  no masses, no organomegaly, no hepatosplenomegaly.   Genitalia:    Normal female without lesion, discharge or tenderness  Rectal:    Normal tone, normal prostate, no masses or tenderness;   guaiac negative stool  Extremities:   Extremities normal, atraumatic, no cyanosis or edema  Pulses:   2+ and symmetric all extremities  Skin:   Skin color, texture, turgor normal, no rashes or lesions  Lymph nodes:   Cervical, supraclavicular, and axillary nodes normal  Neurologic:   CNII-XII intact,  normal strength, sensation and reflexes    throughout      LAB RESULTS:  Recent Results (from the past 2160 hour(s))  CBC with Differential/Platelet     Status: Abnormal   Collection Time: 01/29/15 10:08 AM  Result Value Ref Range   WBC 10.3 3.6 - 11.0 K/uL   RBC 3.90 3.80 - 5.20 MIL/uL   Hemoglobin 13.6 12.0 - 16.0 g/dL   HCT 39.6 35.0 - 47.0 %   MCV 101.7 (H) 80.0 - 100.0 fL   MCH 34.8 (H) 26.0 - 34.0 pg   MCHC 34.2 32.0 - 36.0 g/dL   RDW 12.8 11.5 - 14.5 %   Platelets 346 150 - 440 K/uL   Neutrophils Relative % 77 %   Neutro Abs 8.0 (H) 1.4 - 6.5 K/uL   Lymphocytes Relative 12 %   Lymphs Abs 1.2 1.0 - 3.6 K/uL   Monocytes Relative 9 %   Monocytes Absolute 0.9 0.2 - 0.9 K/uL   Eosinophils Relative 1 %   Eosinophils Absolute 0.1 0 - 0.7 K/uL   Basophils Relative 1 %   Basophils Absolute 0.1 0 - 0.1 K/uL  Jak2 w/reflex to CALR/MPL     Status: None   Collection Time: 01/29/15 11:36 AM  Result Value Ref Range   JAK2 GenotypR Comment     Comment: (NOTE) Result: NEGATIVE for the JAK2 V617F mutation. Interpretation:  The G to T nucleotide change encoding the V617F mutation was not detected.  This result does not rule out the presence of the JAK2 mutation at a level below the sensitivity of detection of this assay, or the presence of other mutations within JAK2 not detected by this assay.  This result does not rule out a diagnosis of polycythemia vera, essential thrombocythemia or idiopathic myelofibrosis as the V617F mutation is not detected in all patients with these disorders.    BACKGROUND: Comment     Comment: (NOTE) JAK2 is a cytoplasmic tyrosine kinase with a key role in signal transduction from multiple hematopoietic growth factor receptors. A point mutation within exon 14 of the JAK2 gene (N2355D) encoding a valine to phenylalanine substitution at position 617 of the JAK2 protein (V617F) has been identified in most patients with polycythemia vera, and in about  half of those with either essential thrombocythemia or idiopathic myelofibrosis.  The V617F has also been detected, although infrequently, in other myeloid disorders such as chronic myelomonocytic leukemia and chronic neutrophilic leukemia. V617F is an acquired mutation that alters a highly conserved valine present in the negative regulatory JH2 domain of the JAK2 protein and is predicted to dysregulate kinase activity. Methodology: Genomic DNA was purified from the provided specimen.  Allele- specific PCR using fluorescent primers was used to simultaneously amplify both the wild type and mutant alleles. Amplificati on products were analyzed by capillary electrophoresis.  This assay has a sensitivity to detect approximately a 5% population of cells containing the V617F mutation in a background of non-mutant cells. Reference: [employer]  and [employer].  The JAK2 V617F Tyrosine Kinase Mutation in Myeloproliferative  Disorders:  Status Report and Immediate Implications for Disease Classification and Diagnosis. Mayo Clin Proc 2005;80(7):947-958.    Director Review, JAK2 Comment     Comment: (NOTE) Jacklynn Bue MS, PhD, [place] [facility] and [place], [place] [phone]    Reflex: Comment     Comment: (NOTE) Reflex to CALR Mutation Analysis and MPL Mutation Analysis is indicated. Performed At: [facility] [address] [place], [place] [id number] Nechama Guard MD [id number] Performed At: [facility] [place] [facility], [place] [id number] Nechama Guard MD ER:[id number]   CALR + MPL     Status: None   Collection Time: 01/29/15 11:36 AM  Result Value Ref Range   CALR Mutation Detection Result Comment     Comment: (NOTE) NEGATIVE No insertions or deletions were detected within the analyzed region of the calreticulin (CALR) gene. A negative result does not entirely exclude  the possibility of a clonal population carrying CALR gene mutations that are not covered by this assay. Results should be interpreted in conjunction with clinical and laboratory findings for the most accurate interpretation.    Background: Comment     Comment: (NOTE) The calcium-binding endoplasmic reticulin chaperone protein, calreticulin (CALR), is somatically mutated in approximately 70% of patients with JAK2-negative essential thrombocythemia (ET) and 60- 88% of patients with JAK2-negative primary myelofibrosis. Only a minority of patients (approximately 8%) with myelodysplasia have mutations in CALR gene. CALR mutations are rarely detected in patients with de novo acute myeloid leukemia, chronic myeloid leukemia, lymphoid leukemia, or solid tumors. CALR mutations are not detected in polycythemia and and appear to be mutually exclusive with JAK2 mutations and MPL mutations. The majority of mutational changes involve a variety of insertion or deletion mutations in exon 9 of the calreticulin gene: approximately 53% of all CALR mutations are a 52 bp deletion while the second most prevalent mutation (approximately 32%) contains a 5 bp insertion. The remaining mutations consist of mostly deletions ranging from 1 to 52 bp and 1  or 2 insertion mutations. The detection of a CALR gene mutation aids in the specific diagnosis of a myeloproliferative neoplasm, and help distinguish this clonal disease from a benign reactive process. The presence of a CALR gene mutation may predict a more indolent disease course with a lower thrombotic risk and longer overall survival (relative to those with a JAK2 mutation).    Methodology: Comment     Comment: (NOTE) Genomic DNA was isolated from the provided specimen. Polymerase chain reaction (PCR) of exon 9 of the CALR gene was performed with specific fluorescent-labeled primers, and the PCR product was analyzed by capillary gel electrophoresis to  determine the size of the PCR products. This PCR assay is capable of detecting a mutant cell population with a sensitivity of 5 mutant cells per 100 normal cells. A negative result does not exclude the presence of a myeloproliferative disorder or other neoplastic process. This test was developed and its performance characteristics determined by [facility]. It has not been cleared or approved by the Food and Drug Administration. The FDA has determined that such clearance or approval is not necessary.    References: Comment     Comment: (NOTE) 1. Klampfel, T. et al. (2013) Somatic mutations of calreticulin in   myeloproliferative neoplasms. [place]. J. Med. [phone]. 2. Haywood Lasso, J. et al. (2013) Somatic CALR mutations in   myeloproliferative  neoplasms with nonmutated JAK2. [place]. J.   Med. [phone].    Director Review Comment     Comment: (NOTE) Jacklynn Bue MS, PhD, [place] [facility] and [place], [place] [phone]    MPL MUTATION ANALYSIS RESULT: Comment     Comment: (NOTE) No MPL mutation was identified in the provided specimen of this individual. Results should be interpreted in conjunction with clinical and other laboratory findings for the most accurate interpretation.    BACKGROUND: Comment     Comment: (NOTE) MPL (myeloproliferative leukemia virus oncogene homology) belongs to the hematopoietin superfamily and enables its ligand thrombopoietin to facilitate both global hematopoiesis and megakaryocyte growth and differentiation. MPL W515 mutations are present in patients with primary myelofibrosis (PMF) and essential thrombocythemia (ET) at a frequency of approximately 5% and 1% respectively. The S505 mutation is detected in patients with hereditary thrombocythemia.    METHODOLOGY: Comment     Comment: (NOTE) Genomic DNA was purified from the provided specimen. MPL gene  region covering the S505N and W515L/K mutations were subjected to PCR amplification and bi-directional sequencing in duplicate to identify sequence variations. This assay has a sensitivity to detect approximately 20-25% population of cells containing the MPL mutations in a background of non-mutant cells. This assay will not detect the mutation below the sensitivity of this assay. Molecular- based testing is highly accurate, but as in any laboratory test, rare diagnostic errors may occur.    REFERENCES: Comment     Comment: (NOTE) 1. Pardanani AD, et al. (2006). MPL515 mutations in   myeloproliferative and other myeloid disorders: a study   of 1182 patients. Blood 384:6659-9357. 2. Andre Lefort and Levine RL. (2008). JAK2 and MPL   mutations in myeloproliferative neoplasms: discovery and   science. Leukemia 22:1813-1817. 3. Juline Patch, et al. (2009). Evidence for a founder effect   of the MPL-S505N mutation in eight [place] pedigrees with   hereditary thrombocythemia. Haematologica 94(10):1368-   0177.    DIRECTOR REVIEW: Comment     Comment: (NOTE) Jacklynn Bue MS, PhD, [place] [facility] and [place], [place] Performed At: [facility] [address] [place], [place] [id number] Nechama Guard MD ZR:[id number] Performed At: [facility] [address] [place], [place] [id number] Nechama Guard MD LH:[id number]     STUDIES: Mm Screening Breast Tomo Bilateral  02/12/2015  CLINICAL DATA:  Screening. EXAM: DIGITAL SCREENING BILATERAL MAMMOGRAM WITH 3D TOMO WITH CAD COMPARISON:  Previous exam(s). ACR Breast Density Category c: The breast tissue is heterogeneously dense, which may obscure small masses. FINDINGS: There are no findings suspicious for malignancy. Images were processed with CAD. IMPRESSION: No mammographic evidence of malignancy. A result letter of this screening  mammogram will be mailed directly to the patient. RECOMMENDATION: Screening mammogram in one year. (Code:SM-B-01Y) BI-RADS CATEGORY  1: Negative. Electronically Signed   By: Franki Cabot M.D.   On: 02/12/2015 17:05    ASSESSMENT AND PLAN: 1. Thrombocytosis-Mrs. 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 Mutational analysis returned negative for [id number] F, calreticulin and MPL mutations. These results essentially cover approximately 85% of patients with essential thrombocythemia, and although approximately 15% of patients with essential thrombus that do not have any of the above-mentioned mutations, these patients tend to fair worse with high risk of  transformation to myelofibrosis and acute leukemia. Mrs. Broyles has done remarkably well over the years, which again raises questions of whether the diagnosis is indeed essential thrombocythemia, or secondary thrombocytosis due to smoking. The only way to make the diagnosis appropriately is to perform a bone marrow biopsy, and Miss Dietzman would like to proceed with this procedure to be done after Christmas. She will return to our clinic 2 weeks later to discuss the results of the biopsy. She will continue with hydroxyurea and low-dose aspirin until the diagnosis is established. 2. Smoking cessation- we discussed the necessity of smoking cessation as prophylaxis of cardiovascular diseases as well as lung, pancreas, bladder cancer. The patient declined a referral to smoking cessation program. She also declined the referral for screening CT chest.  3. Need for colonoscopy-patient has never had colonoscopy and is overdue for one. We discussed the necessity of colonoscopy as screening for colon cancer. The patient declined the referral. She also declined the offered fecal occult blood test. 4. Mammogram-the patient had a  mammogram done on November 28, and it was negative.  Patient expressed understanding and was in agreement with this plan. She also understands that She can call clinic at any time with any questions, concerns, or complaints.    I spent 25 min discussing this case with more than 50% of the visit spent in counseling and coordination of care.   Roxana Hires, MD   02/19/2015 9:30 AM

## 2015-03-09 ENCOUNTER — Other Ambulatory Visit: Payer: Self-pay | Admitting: Radiology

## 2015-03-13 ENCOUNTER — Encounter: Payer: Self-pay | Admitting: Internal Medicine

## 2015-03-13 ENCOUNTER — Ambulatory Visit
Admission: RE | Admit: 2015-03-13 | Discharge: 2015-03-13 | Disposition: A | Discharge status: Home / Self Care | Payer: Medicare Other | Source: Ambulatory Visit | Attending: Internal Medicine | Admitting: Internal Medicine

## 2015-03-13 DIAGNOSIS — D473 Essential (hemorrhagic) thrombocythemia: Secondary | ICD-10-CM | POA: Insufficient documentation

## 2015-03-13 DIAGNOSIS — Z7982 Long term (current) use of aspirin: Secondary | ICD-10-CM | POA: Diagnosis not present

## 2015-03-13 DIAGNOSIS — Z7289 Other problems related to lifestyle: Secondary | ICD-10-CM | POA: Insufficient documentation

## 2015-03-13 DIAGNOSIS — D696 Thrombocytopenia, unspecified: Secondary | ICD-10-CM | POA: Insufficient documentation

## 2015-03-13 DIAGNOSIS — Z803 Family history of malignant neoplasm of breast: Secondary | ICD-10-CM | POA: Insufficient documentation

## 2015-03-13 DIAGNOSIS — Z01812 Encounter for preprocedural laboratory examination: Secondary | ICD-10-CM | POA: Diagnosis present

## 2015-03-13 DIAGNOSIS — F17210 Nicotine dependence, cigarettes, uncomplicated: Secondary | ICD-10-CM | POA: Diagnosis not present

## 2015-03-13 HISTORY — DX: Disease of blood and blood-forming organs, unspecified: D75.9

## 2015-03-13 LAB — CBC WITH DIFFERENTIAL/PLATELET
Basophils Absolute: 0.1 10*3/uL (ref 0–0.1)
Basophils Relative: 1 %
EOS ABS: 0.1 10*3/uL (ref 0–0.7)
Eosinophils Relative: 1 %
HCT: 39.5 % (ref 35.0–47.0)
HEMOGLOBIN: 13.4 g/dL (ref 12.0–16.0)
LYMPHS ABS: 1.7 10*3/uL (ref 1.0–3.6)
LYMPHS PCT: 20 %
MCH: 35 pg — AB (ref 26.0–34.0)
MCHC: 33.9 g/dL (ref 32.0–36.0)
MCV: 103.3 fL — AB (ref 80.0–100.0)
MONOS PCT: 8 %
Monocytes Absolute: 0.7 10*3/uL (ref 0.2–0.9)
NEUTROS PCT: 70 %
Neutro Abs: 5.7 10*3/uL (ref 1.4–6.5)
Platelets: 360 10*3/uL (ref 150–440)
RBC: 3.82 MIL/uL (ref 3.80–5.20)
RDW: 13.1 % (ref 11.5–14.5)
WBC: 8.3 10*3/uL (ref 3.6–11.0)

## 2015-03-13 LAB — PROTIME-INR
INR: 0.94
PROTHROMBIN TIME: 12.8 s (ref 11.4–15.0)

## 2015-03-13 MED ORDER — MIDAZOLAM HCL 5 MG/5ML IJ SOLN
INTRAMUSCULAR | Status: AC | PRN
  Administered 2015-03-13: 0.5 mg via INTRAVENOUS
  Administered 2015-03-13: 1 mg via INTRAVENOUS
  Administered 2015-03-13: 0.5 mg via INTRAVENOUS

## 2015-03-13 MED ORDER — SODIUM CHLORIDE 0.9 % IV SOLN
INTRAVENOUS | Status: DC
  Administered 2015-03-13: 09:00:00 via INTRAVENOUS

## 2015-03-13 MED ORDER — FENTANYL CITRATE (PF) 100 MCG/2ML IJ SOLN
INTRAMUSCULAR | Status: AC | PRN
  Administered 2015-03-13 (×2): 25 ug via INTRAVENOUS

## 2015-03-13 NOTE — Procedures (Signed)
R iliac BM Bx and aspirate No comp/EBL

## 2015-03-13 NOTE — Consult Note (Signed)
Chief Complaint: Patient was seen in consultation today for No chief complaint on file.  at the request of [place]  Referring Physician(s): Berenzon,Dmitriy  History of Present Illness: Debra Holmes is a 69 y.o. female with thrombocytosis, here for BM Bx and definitive diagnoiss. She has been treated wityh hydroxyurea and her counts have been within normal limits. She has no complaints.  Past Medical History  Diagnosis Date  . Encounter for screening mammogram for breast cancer 10/06/2014  . Thrombocytosis ([place]) 01/29/2015  . Blood dyscrasia     thrombocytopenia    Past Surgical History  Procedure Laterality Date  . Breast biopsy Right 1979, 1973    neg    Allergies: Penicillins  Medications: Prior to Admission medications   Medication Sig Start Date End Date Taking? Authorizing Provider  aspirin 81 MG tablet Take 81 mg by mouth daily.   Yes Historical Provider, MD  b complex vitamins tablet Take 1 tablet by mouth daily.   Yes Historical Provider, MD  hydroxyurea (HYDREA) 500 MG capsule 1 capsule. 01/16/15  Yes Historical Provider, MD     Family History  Problem Relation Age of Onset  . Breast cancer Sister 60    Social History   Social History  . Marital Status: Divorced    Spouse Name: N/A  . Number of Children: N/A  . Years of Education: N/A   Social History Main Topics  . Smoking status: Current Every Day Smoker -- 1.00 packs/day for 56 years  . Smokeless tobacco: Never Used  . Alcohol Use: Yes     Comment: occasioanal  . Drug Use: No  . Sexual Activity: Not Asked   Other Topics Concern  . None   Social History Narrative    ECOG Status: 0 - Asymptomatic  Review of Systems: A 12 point ROS discussed and pertinent positives are indicated in the HPI above.  All other systems are negative.  Review of Systems  Vital Signs: BP 147/83 mmHg  Pulse 101  Temp(Src) 98.1 F (36.7 C) (Oral)  Resp 16  Ht 5' 7"  (1.702 m)  Wt 122 lb  (55.339 kg)  BMI 19.10 kg/m2  SpO2 99%  Physical Exam  Constitutional: She is oriented to person, place, and time. She appears well-developed and well-nourished.  Cardiovascular: Normal rate and regular rhythm.   Pulmonary/Chest: Effort normal and breath sounds normal.  Neurological: She is alert and oriented to person, place, and time.  Skin: Skin is warm and dry.    Mallampati Score:   1  Imaging: Mm Screening Breast Tomo Bilateral  02/12/2015  CLINICAL DATA:  Screening. EXAM: DIGITAL SCREENING BILATERAL MAMMOGRAM WITH 3D TOMO WITH CAD COMPARISON:  Previous exam(s). ACR Breast Density Category c: The breast tissue is heterogeneously dense, which may obscure small masses. FINDINGS: There are no findings suspicious for malignancy. Images were processed with CAD. IMPRESSION: No mammographic evidence of malignancy. A result letter of this screening mammogram will be mailed directly to the patient. RECOMMENDATION: Screening mammogram in one year. (Code:SM-B-01Y) BI-RADS CATEGORY  1: Negative. Electronically Signed   By: Franki Cabot M.D.   On: 02/12/2015 17:05    Labs:  CBC:  Recent Labs  09/28/14 1003 01/29/15 1008 03/13/15 0801  WBC 7.7 10.3 8.3  HGB 13.6 13.6 13.4  HCT 40.5 39.6 39.5  PLT 321 346 360    COAGS:  Recent Labs  03/13/15 0801  INR 0.94    BMP: No results for input(s): NA, K, CL, CO2,  GLUCOSE, BUN, CALCIUM, CREATININE, GFRNONAA, GFRAA in the last 8760 hours.  Invalid input(s): CMP  LIVER FUNCTION TESTS: No results for input(s): BILITOT, AST, ALT, ALKPHOS, PROT, ALBUMIN in the last 8760 hours.  TUMOR MARKERS: No results for input(s): AFPTM, CEA, CA199, CHROMGRNA in the last 8760 hours.  Assessment and Plan:  Thrombosytosis. BM biopsy to follow.  Thank you for this interesting consult.  I greatly enjoyed meeting [place] and look forward to participating in their care.  A copy of this report was sent to the requesting provider on this  date.  Signed: Zen Cedillos, ART A 03/13/2015, 8:40 AM   I spent a total of  30 Minutes   in face to face in clinical consultation, greater than 50% of which was counseling/coordinating care for bone marrow biopsy.

## 2015-04-10 ENCOUNTER — Ambulatory Visit: Payer: Medicare Other | Admitting: Hematology and Oncology

## 2015-04-11 ENCOUNTER — Encounter: Payer: Self-pay | Admitting: Internal Medicine

## 2015-04-11 ENCOUNTER — Inpatient Hospital Stay: Payer: Medicare HMO | Attending: Internal Medicine | Admitting: Internal Medicine

## 2015-04-11 VITALS — BP 145/82 | HR 85 | Temp 97.6°F | Resp 18 | Ht 65.0 in

## 2015-04-11 DIAGNOSIS — F17210 Nicotine dependence, cigarettes, uncomplicated: Secondary | ICD-10-CM

## 2015-04-11 DIAGNOSIS — D473 Essential (hemorrhagic) thrombocythemia: Secondary | ICD-10-CM | POA: Diagnosis not present

## 2015-04-11 DIAGNOSIS — Z7982 Long term (current) use of aspirin: Secondary | ICD-10-CM | POA: Diagnosis not present

## 2015-04-11 DIAGNOSIS — D75839 Thrombocytosis, unspecified: Secondary | ICD-10-CM

## 2015-04-11 DIAGNOSIS — Z79899 Other long term (current) drug therapy: Secondary | ICD-10-CM | POA: Diagnosis not present

## 2015-04-11 DIAGNOSIS — R69 Illness, unspecified: Secondary | ICD-10-CM | POA: Diagnosis not present

## 2015-04-11 NOTE — Progress Notes (Signed)
Here to get bone marrow results

## 2015-04-11 NOTE — Patient Instructions (Signed)
Stop taking Hydrea per Dr. Rudean Hitt and he will montior platelet count each month

## 2015-04-11 NOTE — Progress Notes (Signed)
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 She is never had bone marrow biopsy to confirm the diagnosis, nor she was tested for [id number], calreticulin or MPL mutations. Testing performed in November 2016 was negative for all 3 mutations. She was treated with anagrelide for short periods of time, but developed multiple gastrointestinal complaints, headaches and palpitations. She has been on low dose hydroxyurea since approximately 2009. She has not taken low-dose aspirin due to history of epistaxis. No flowsheet data found.  INTERVAL HISTORY:   Debra Holmes return to our clinic to discuss the results of the bone marrow biopsy. Since her last appointment she has done well, she tolerated bone marrow biopsy very well. She does not have any active complaints at this time. She denies fevers, chills, night sweats, weight loss, nausea, vomiting, diarrhea, constipation, chest pain, shortness of breath. REVIEW OF SYSTEMS:   As per HPI. Otherwise, a complete review of systems is negatve.  PAST MEDICAL HISTORY: Past Medical History  Diagnosis Date  . Encounter for screening mammogram for breast cancer 10/06/2014  . Thrombocytosis ([place]) 01/29/2015  . Blood dyscrasia     thrombocytopenia    PAST SURGICAL HISTORY: Past Surgical History  Procedure Laterality Date  . Breast biopsy Right 1979, 1973    neg    FAMILY HISTORY Family History  Problem Relation Age of Onset  . Breast cancer Sister 57    ADVANCED DIRECTIVES:  No flowsheet data found.  HEALTH MAINTENANCE: Social History  Substance Use Topics  . Smoking status: Current Every Day Smoker -- 1.00 packs/day for 56 years  .  Smokeless tobacco: Never Used  . Alcohol Use: Yes     Comment: occasioanal      Allergies  Allergen Reactions  . Penicillins Rash    Current Outpatient Prescriptions  Medication Sig Dispense Refill  . aspirin 81 MG tablet Take 81 mg by mouth daily.    Marland Kitchen b complex vitamins tablet Take 1 tablet by mouth daily.    . hydroxyurea (HYDREA) 500 MG capsule 1 capsule.     No current facility-administered medications for this visit.    OBJECTIVE: ECOG FS:0 - Asymptomatic  PHYSICAL EXAM: BP 145/82 mmHg  Pulse 85  Temp(Src) 97.6 F (36.4 C) (Tympanic)  Resp 18  Ht 5' 5"  (1.651 m)  General Appearance:    Alert, cooperative, no distress, appears younger than stated age 31 female  Head:    Normocephalic, without obvious abnormality, atraumatic, partial dentures   Eyes:    PERRL, conjunctiva/corneas clear, EOM's intact, fundi    benign, both eyes  Ears:    Normal TM's and external ear canals, both ears  Nose:   Nares normal, septum midline, mucosa normal, no drainage    or sinus tenderness  Throat:   Lips, mucosa, and tongue normal; teeth and gums normal  Neck:   Supple, symmetrical, trachea midline, no adenopathy;    thyroid:  no enlargement/tenderness/nodules; no carotid   bruit or JVD  Back:     Symmetric, no curvature, ROM normal, no CVA tenderness  Lungs:     Clear to auscultation bilaterally, respirations unlabored  Chest Wall:    No tenderness or deformity   Heart:  Regular rate and rhythm, S1 and S2 normal, no murmur, rub   or gallop  Breast Exam:    No tenderness, masses, or nipple abnormality  Abdomen:     Soft, non-tender, bowel sounds active all four quadrants,    no masses, no organomegaly, no hepatosplenomegaly.   Genitalia:    Normal female without lesion, discharge or tenderness  Rectal:    Normal tone, normal prostate, no masses or tenderness;   guaiac negative stool  Extremities:   Extremities normal, atraumatic, no cyanosis or edema  Pulses:   2+ and  symmetric all extremities  Skin:   Skin color, texture, turgor normal, no rashes or lesions  Lymph nodes:   Cervical, supraclavicular, and axillary nodes normal  Neurologic:   CNII-XII intact, normal strength, sensation and reflexes    throughout      LAB RESULTS:  Recent Results (from the past 2160 hour(s))  CBC with Differential/Platelet     Status: Abnormal   Collection Time: 01/29/15 10:08 AM  Result Value Ref Range   WBC 10.3 3.6 - 11.0 K/uL   RBC 3.90 3.80 - 5.20 MIL/uL   Hemoglobin 13.6 12.0 - 16.0 g/dL   HCT 39.6 35.0 - 47.0 %   MCV 101.7 (H) 80.0 - 100.0 fL   MCH 34.8 (H) 26.0 - 34.0 pg   MCHC 34.2 32.0 - 36.0 g/dL   RDW 12.8 11.5 - 14.5 %   Platelets 346 150 - 440 K/uL   Neutrophils Relative % 77 %   Neutro Abs 8.0 (H) 1.4 - 6.5 K/uL   Lymphocytes Relative 12 %   Lymphs Abs 1.2 1.0 - 3.6 K/uL   Monocytes Relative 9 %   Monocytes Absolute 0.9 0.2 - 0.9 K/uL   Eosinophils Relative 1 %   Eosinophils Absolute 0.1 0 - 0.7 K/uL   Basophils Relative 1 %   Basophils Absolute 0.1 0 - 0.1 K/uL  Jak2 w/reflex to CALR/MPL     Status: None   Collection Time: 01/29/15 11:36 AM  Result Value Ref Range   JAK2 GenotypR Comment     Comment: (NOTE) Result: NEGATIVE for the JAK2 V617F mutation. Interpretation:  The G to T nucleotide change encoding the V617F mutation was not detected.  This result does not rule out the presence of the JAK2 mutation at a level below the sensitivity of detection of this assay, or the presence of other mutations within JAK2 not detected by this assay.  This result does not rule out a diagnosis of polycythemia vera, essential thrombocythemia or idiopathic myelofibrosis as the V617F mutation is not detected in all patients with these disorders.    BACKGROUND: Comment     Comment: (NOTE) JAK2 is a cytoplasmic tyrosine kinase with a key role in signal transduction from multiple hematopoietic growth factor receptors. A point mutation within exon 14  of the JAK2 gene ([id number]) encoding a valine to phenylalanine substitution at position 617 of the JAK2 protein (V617F) has been identified in most patients with polycythemia vera, and in about half of those with either essential thrombocythemia or idiopathic myelofibrosis.  The V617F has also been detected, although infrequently, in other myeloid disorders such as chronic myelomonocytic leukemia and chronic neutrophilic leukemia. V617F is an acquired mutation that alters a highly conserved valine present in the negative regulatory JH2 domain of the JAK2 protein and is predicted to dysregulate kinase activity. Methodology: Genomic DNA was purified from the provided specimen.  Allele- specific PCR using fluorescent primers was used to  simultaneously amplify both the wild type and mutant alleles. Amplificati on products were analyzed by capillary electrophoresis.  This assay has a sensitivity to detect approximately a 5% population of cells containing the V617F mutation in a background of non-mutant cells. Reference: Tefferi A and Gilliland DG.  The JAK2 V617F Tyrosine Kinase Mutation in Myeloproliferative  Disorders:  Status Report and Immediate Implications for Disease Classification and Diagnosis. Mayo Clin Proc 2005;80(7):947-958.    Director Review, JAK2 Comment     Comment: (NOTE) Jacklynn Bue MS, PhD, [place] for [facility] and [place], [place] [phone]    Reflex: Comment     Comment: (NOTE) Reflex to CALR Mutation Analysis and MPL Mutation Analysis is indicated. Performed At: [facility] [address] [place], [place] [id number] Nechama Guard MD [id number]:[id number] Performed At: [facility] [place], [place] [id number] Nechama Guard MD [id number]   CALR + MPL     Status: None   Collection Time: 01/29/15 11:36 AM  Result Value Ref Range   CALR Mutation Detection  Result Comment     Comment: (NOTE) NEGATIVE No insertions or deletions were detected within the analyzed region of the calreticulin (CALR) gene. A negative result does not entirely exclude the possibility of a clonal population carrying CALR gene mutations that are not covered by this assay. Results should be interpreted in conjunction with clinical and laboratory findings for the most accurate interpretation.    Background: Comment     Comment: (NOTE) The calcium-binding endoplasmic reticulin chaperone protein, calreticulin (CALR), is somatically mutated in approximately 70% of patients with JAK2-negative essential thrombocythemia (ET) and 60- 88% of patients with JAK2-negative primary myelofibrosis. Only a minority of patients (approximately 8%) with myelodysplasia have mutations in CALR gene. CALR mutations are rarely detected in patients with de novo acute myeloid leukemia, chronic myeloid leukemia, lymphoid leukemia, or solid tumors. CALR mutations are not detected in polycythemia and and appear to be mutually exclusive with JAK2 mutations and MPL mutations. The majority of mutational changes involve a variety of insertion or deletion mutations in exon 9 of the calreticulin gene: approximately 53% of all CALR mutations are a 52 bp deletion while the second most prevalent mutation (approximately 32%) contains a 5 bp insertion. The remaining mutations consist of mostly deletions ranging from 1 to 52 bp and 1  or 2 insertion mutations. The detection of a CALR gene mutation aids in the specific diagnosis of a myeloproliferative neoplasm, and help distinguish this clonal disease from a benign reactive process. The presence of a CALR gene mutation may predict a more indolent disease course with a lower thrombotic risk and longer overall survival (relative to those with a JAK2 mutation).    Methodology: Comment     Comment: (NOTE) Genomic DNA was isolated from the provided  specimen. Polymerase chain reaction (PCR) of exon 9 of the CALR gene was performed with specific fluorescent-labeled primers, and the PCR product was analyzed by capillary gel electrophoresis to determine the size of the PCR products. This PCR assay is capable of detecting a mutant cell population with a sensitivity of 5 mutant cells per 100 normal cells. A negative result does not exclude the presence of a myeloproliferative disorder or other neoplastic process. This test was developed and its performance characteristics determined by [facility]. It has not been cleared or approved by the Food and Drug Administration. The FDA has determined that such clearance or approval is not necessary.  References: Comment     Comment: (NOTE) 1. Klampfel, T. et al. (2013) Somatic mutations of calreticulin in   myeloproliferative neoplasms. [place]. J. Med. 606:3016-0109. 2. Haynes Kerns et al. (2013) Somatic CALR mutations in   myeloproliferative neoplasms with nonmutated JAK2. [place]. J.   Med. [phone].    Director Review Comment     Comment: (NOTE) Jacklynn Bue MS, PhD, [place] for Molecular Biology and [place], [place] [phone]    MPL MUTATION ANALYSIS RESULT: Comment     Comment: (NOTE) No MPL mutation was identified in the provided specimen of this individual. Results should be interpreted in conjunction with clinical and other laboratory findings for the most accurate interpretation.    BACKGROUND: Comment     Comment: (NOTE) MPL (myeloproliferative leukemia virus oncogene homology) belongs to the hematopoietin superfamily and enables its ligand thrombopoietin to facilitate both global hematopoiesis and megakaryocyte growth and differentiation. MPL W515 mutations are present in patients with primary myelofibrosis (PMF) and essential thrombocythemia (ET) at a frequency of approximately 5% and 1% respectively. The  S505 mutation is detected in patients with hereditary thrombocythemia.    METHODOLOGY: Comment     Comment: (NOTE) Genomic DNA was purified from the provided specimen. MPL gene region covering the S505N and W515L/K mutations were subjected to PCR amplification and bi-directional sequencing in duplicate to identify sequence variations. This assay has a sensitivity to detect approximately 20-25% population of cells containing the MPL mutations in a background of non-mutant cells. This assay will not detect the mutation below the sensitivity of this assay. Molecular- based testing is highly accurate, but as in any laboratory test, rare diagnostic errors may occur.    REFERENCES: Comment     Comment: (NOTE) 1. Pardanani AD, et al. (2006). MPL515 mutations in   myeloproliferative and other myeloid disorders: a study   of 1182 patients. Blood 542:7062-3762. 2. Andre Lefort and Levine RL. (2008). JAK2 and MPL   mutations in myeloproliferative neoplasms: discovery and   science. Leukemia 22:1813-1817. 3. Juline Patch, et al. (2009). Evidence for a founder effect   of the MPL-S505N mutation in eight [place] pedigrees with   hereditary thrombocythemia. Haematologica 94(10):1368-   8315.    DIRECTOR REVIEW: Comment     Comment: (NOTE) Jacklynn Bue MS, PhD, [place] for Molecular Biology and [place], [place] [phone] Performed At: [facility] [place], [place] [id number] Nechama Guard MD [id number] Performed At: [facility] [address] [place], [place] [id number] Nechama Guard MD [id number]   CBC with Differential/Platelet     Status: Abnormal   Collection Time: 03/13/15  8:01 AM  Result Value Ref Range   WBC 8.3 3.6 - 11.0 K/uL   RBC 3.82 3.80 - 5.20 MIL/uL   Hemoglobin 13.4 12.0 - 16.0 g/dL   HCT 39.5 35.0 - 47.0 %   MCV 103.3 (H) 80.0 - 100.0 fL   MCH 35.0 (H) 26.0 -  34.0 pg   MCHC 33.9 32.0 - 36.0 g/dL   RDW 13.1 11.5 - 14.5 %   Platelets 360 150 - 440 K/uL   Neutrophils Relative % 70 %   Neutro Abs 5.7 1.4 - 6.5 K/uL   Lymphocytes Relative 20 %   Lymphs Abs 1.7 1.0 - 3.6 K/uL   Monocytes Relative 8 %   Monocytes Absolute 0.7 0.2 - 0.9 K/uL   Eosinophils Relative 1 %  Eosinophils Absolute 0.1 0 - 0.7 K/uL   Basophils Relative 1 %   Basophils Absolute 0.1 0 - 0.1 K/uL  Protime-INR     Status: None   Collection Time: 03/13/15  8:01 AM  Result Value Ref Range   Prothrombin Time 12.8 11.4 - 15.0 seconds   INR 0.94     STUDIES: Ct Biopsy  03/13/2015  CLINICAL DATA:  Thrombocytosis EXAM: CT-GUIDED BIOPSY BONE MARROW ASPIRATE AND CORE. MEDICATIONS AND MEDICAL HISTORY: Versed 2 mg, Fentanyl 50 mcg. Additional Medications: None. ANESTHESIA/SEDATION: Moderate sedation time: 13 minutes PROCEDURE: The procedure, risks, benefits, and alternatives were explained to the patient. Questions regarding the procedure were encouraged and answered. The patient understands and consents to the procedure. The back was prepped with ChloraPrep in a sterile fashion, and a sterile drape was applied covering the operative field. A sterile gown and sterile gloves were used for the procedure. Under CT guidance, an 11 gauge needle was inserted into the right iliac bone via posterior approach. Aspirates and a core were obtain. Final imaging was performed. Patient tolerated the procedure well without complication. Vital sign monitoring by nursing staff during the procedure will continue as patient is in the special procedures unit for post procedure observation. FINDINGS: The images document guide needle placement within the right iliac bone. Post biopsy images demonstrate no hemorrhage. COMPLICATIONS: None IMPRESSION: Successful CT-guided bone marrow aspirate and core. Electronically Signed   By: Marybelle Killings M.D.   On: 03/13/2015 12:33   bone marrow biopsy: Normal cellular for age  bone marrow with no evidence of myeloid proliferation, or increase in reticulin fibers. Mild atypia of megakaryocytes.  ASSESSMENT AND PLAN: 1. Thrombocytosis-Mrs. 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 Mutational analysis returned negative for [id number] F, calreticulin and MPL mutations. Bone marrow biopsy, which was performed in January 2017, showed no evidence of myeloid proliferation, myelofibrosis. Mild atypia of megakaryocytes is common in patients, taking hydroxyurea. Although long-term treatment of essential thrombocythemia with hydroxyurea can lead to complete morphologic remission, in the setting of uncertainty about the diagnosis it appears reasonable to hold hydroxyurea at this point and monitor her hematologic parameters on a monthly basis. The patient should continue to take low-dose aspirin. If platelet count starts to increase again, we may consider restarting hydroxyurea, although smoking is a common reason for thrombocytosis, and the patient is a current smoker. Patient expressed understanding and agreement with this plan. She will return to our clinic on a monthly basis for CBC check, and an M.D. visit in 3 months.   2. Mammogram-the patient had a mammogram done on November 28, and it was negative.  Patient expressed understanding and was in agreement with this plan. She also understands that She can call clinic at any time with any questions, concerns, or complaints.      Roxana Hires, MD   04/11/2015 10:27 AM

## 2015-05-16 ENCOUNTER — Inpatient Hospital Stay: Payer: Medicare HMO | Attending: Internal Medicine

## 2015-05-16 DIAGNOSIS — D473 Essential (hemorrhagic) thrombocythemia: Secondary | ICD-10-CM | POA: Insufficient documentation

## 2015-05-16 DIAGNOSIS — D75839 Thrombocytosis, unspecified: Secondary | ICD-10-CM

## 2015-05-16 LAB — CBC WITH DIFFERENTIAL/PLATELET
BASOS ABS: 0.1 10*3/uL (ref 0–0.1)
Basophils Relative: 1 %
EOS PCT: 1 %
Eosinophils Absolute: 0.1 10*3/uL (ref 0–0.7)
HEMATOCRIT: 39.9 % (ref 35.0–47.0)
Hemoglobin: 13.7 g/dL (ref 12.0–16.0)
LYMPHS ABS: 1.6 10*3/uL (ref 1.0–3.6)
LYMPHS PCT: 22 %
MCH: 33.9 pg (ref 26.0–34.0)
MCHC: 34.5 g/dL (ref 32.0–36.0)
MCV: 98.3 fL (ref 80.0–100.0)
MONO ABS: 0.6 10*3/uL (ref 0.2–0.9)
MONOS PCT: 8 %
NEUTROS ABS: 4.9 10*3/uL (ref 1.4–6.5)
Neutrophils Relative %: 68 %
PLATELETS: 318 10*3/uL (ref 150–440)
RBC: 4.06 MIL/uL (ref 3.80–5.20)
RDW: 12.5 % (ref 11.5–14.5)
WBC: 7.3 10*3/uL (ref 3.6–11.0)

## 2015-06-15 ENCOUNTER — Other Ambulatory Visit: Payer: Medicare HMO

## 2015-06-20 ENCOUNTER — Inpatient Hospital Stay: Payer: Medicare HMO | Attending: Family Medicine

## 2015-06-20 DIAGNOSIS — D473 Essential (hemorrhagic) thrombocythemia: Secondary | ICD-10-CM | POA: Diagnosis not present

## 2015-06-20 DIAGNOSIS — D75839 Thrombocytosis, unspecified: Secondary | ICD-10-CM

## 2015-06-20 LAB — CBC WITH DIFFERENTIAL/PLATELET
Basophils Absolute: 0.1 10*3/uL (ref 0–0.1)
Basophils Relative: 1 %
EOS ABS: 0.1 10*3/uL (ref 0–0.7)
EOS PCT: 1 %
HCT: 40.4 % (ref 35.0–47.0)
Hemoglobin: 14.1 g/dL (ref 12.0–16.0)
LYMPHS ABS: 1.6 10*3/uL (ref 1.0–3.6)
Lymphocytes Relative: 18 %
MCH: 33 pg (ref 26.0–34.0)
MCHC: 34.8 g/dL (ref 32.0–36.0)
MCV: 94.8 fL (ref 80.0–100.0)
MONO ABS: 0.7 10*3/uL (ref 0.2–0.9)
MONOS PCT: 8 %
Neutro Abs: 6.3 10*3/uL (ref 1.4–6.5)
Neutrophils Relative %: 72 %
PLATELETS: 320 10*3/uL (ref 150–440)
RBC: 4.27 MIL/uL (ref 3.80–5.20)
RDW: 12.3 % (ref 11.5–14.5)
WBC: 8.9 10*3/uL (ref 3.6–11.0)

## 2015-07-18 ENCOUNTER — Inpatient Hospital Stay (HOSPITAL_BASED_OUTPATIENT_CLINIC_OR_DEPARTMENT_OTHER): Payer: Medicare HMO | Admitting: Family Medicine

## 2015-07-18 ENCOUNTER — Ambulatory Visit: Payer: Medicare HMO

## 2015-07-18 ENCOUNTER — Inpatient Hospital Stay: Payer: Medicare HMO | Attending: Family Medicine

## 2015-07-18 ENCOUNTER — Encounter: Payer: Self-pay | Admitting: Family Medicine

## 2015-07-18 VITALS — BP 158/83 | HR 89 | Temp 96.6°F | Wt 112.4 lb

## 2015-07-18 DIAGNOSIS — D75839 Thrombocytosis, unspecified: Secondary | ICD-10-CM

## 2015-07-18 DIAGNOSIS — D473 Essential (hemorrhagic) thrombocythemia: Secondary | ICD-10-CM | POA: Insufficient documentation

## 2015-07-18 DIAGNOSIS — Z681 Body mass index (BMI) 19 or less, adult: Secondary | ICD-10-CM

## 2015-07-18 DIAGNOSIS — Z7982 Long term (current) use of aspirin: Secondary | ICD-10-CM

## 2015-07-18 DIAGNOSIS — F17210 Nicotine dependence, cigarettes, uncomplicated: Secondary | ICD-10-CM

## 2015-07-18 DIAGNOSIS — R69 Illness, unspecified: Secondary | ICD-10-CM | POA: Diagnosis not present

## 2015-07-18 DIAGNOSIS — Z803 Family history of malignant neoplasm of breast: Secondary | ICD-10-CM | POA: Insufficient documentation

## 2015-07-18 DIAGNOSIS — Z1231 Encounter for screening mammogram for malignant neoplasm of breast: Secondary | ICD-10-CM

## 2015-07-18 LAB — CBC WITH DIFFERENTIAL/PLATELET
BASOS ABS: 0.1 10*3/uL (ref 0–0.1)
Basophils Relative: 1 %
EOS PCT: 2 %
Eosinophils Absolute: 0.1 10*3/uL (ref 0–0.7)
HCT: 41.4 % (ref 35.0–47.0)
Hemoglobin: 14.2 g/dL (ref 12.0–16.0)
LYMPHS PCT: 18 %
Lymphs Abs: 1.8 10*3/uL (ref 1.0–3.6)
MCH: 32.1 pg (ref 26.0–34.0)
MCHC: 34.4 g/dL (ref 32.0–36.0)
MCV: 93.2 fL (ref 80.0–100.0)
MONO ABS: 0.8 10*3/uL (ref 0.2–0.9)
MONOS PCT: 9 %
Neutro Abs: 6.8 10*3/uL — ABNORMAL HIGH (ref 1.4–6.5)
Neutrophils Relative %: 70 %
PLATELETS: 341 10*3/uL (ref 150–440)
RBC: 4.44 MIL/uL (ref 3.80–5.20)
RDW: 12.3 % (ref 11.5–14.5)
WBC: 9.7 10*3/uL (ref 3.6–11.0)

## 2015-07-18 NOTE — Progress Notes (Signed)
[place]  Telephone:(336) [phone]  Fax:(336) [place] DOB: 06-10-1945  MR#: [id number]  FKC#:[id number]  Patient Care Team: No Pcp Per Patient as PCP - General (General Practice)  CHIEF COMPLAINT:  Chief Complaint  Patient presents with  . 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 She is never had bone marrow biopsy to confirm the diagnosis, nor she was tested for Jak2V617F, calreticulin or MPL mutations. Testing performed in November 2016 was negative for all 3 mutations. She was treated with anagrelide for short periods of time, but developed multiple gastrointestinal complaints, headaches and palpitations. She has been on low dose hydroxyurea since approximately 2009. She is now off of hydroxyurea and is taking a 81 mg aspirin every day.  INTERVAL HISTORY:  Patient is here for further evaluation regarding thrombocytosis. Patient reports overall feeling very well and denies any complaints of fever, chills, night sweats, weight loss, nausea or vomiting. Patient continues to smoke 1 pack per day.  REVIEW OF SYSTEMS:   Review of Systems  Constitutional: Negative for fever, chills, weight loss, malaise/fatigue and diaphoresis.  HENT: Negative.   Eyes: Negative.   Respiratory: Negative for cough, hemoptysis, sputum production, shortness of breath and wheezing.   Cardiovascular: Negative for chest pain, palpitations, orthopnea, claudication, leg swelling and PND.  Gastrointestinal: Negative for heartburn, nausea, vomiting, abdominal pain, diarrhea, constipation, blood in stool and melena.  Genitourinary: Negative.   Musculoskeletal: Negative.   Skin: Negative.   Neurological: Negative for dizziness, tingling, focal weakness, seizures and weakness.  Endo/Heme/Allergies: Bruises/bleeds easily.  Psychiatric/Behavioral: Negative for depression. The patient is not nervous/anxious and does not have  insomnia.     As per HPI. Otherwise, a complete review of systems is negatve.   PAST MEDICAL HISTORY: Past Medical History  Diagnosis Date  . Encounter for screening mammogram for breast cancer 10/06/2014  . Thrombocytosis ([place]) 01/29/2015  . Blood dyscrasia     thrombocytopenia    PAST SURGICAL HISTORY: Past Surgical History  Procedure Laterality Date  . Breast biopsy Right 1979, 1973    neg    FAMILY HISTORY Family History  Problem Relation Age of Onset  . Breast cancer Sister 52    GYNECOLOGIC HISTORY:  No LMP recorded. Patient is postmenopausal.     ADVANCED DIRECTIVES:    HEALTH MAINTENANCE: Social History  Substance Use Topics  . Smoking status: Current Every Day Smoker -- 1.00 packs/day for 56 years  . Smokeless tobacco: Never Used  . Alcohol Use: Yes     Comment: occasioanal     Mammogram:November 2016  Allergies  Allergen Reactions  . Penicillins Rash    Current Outpatient Prescriptions  Medication Sig Dispense Refill  . aspirin 81 MG tablet Take 81 mg by mouth daily.    Marland Kitchen b complex vitamins tablet Take 1 tablet by mouth daily.     No current facility-administered medications for this visit.    OBJECTIVE: BP 158/83 mmHg  Pulse 89  Temp(Src) 96.6 F (35.9 C) (Tympanic)  Wt 112 lb 7 oz (51 kg)   Body mass index is 18.71 kg/(m^2).    ECOG FS:0 - Asymptomatic  General: Well-developed, well-nourished, no acute distress. Eyes: Pink conjunctiva, anicteric sclera. HEENT: Normocephalic, moist mucous membranes, clear oropharnyx. Lungs: Clear to auscultation bilaterally. Heart: Regular rate and rhythm. No rubs, murmurs, or gallops. Musculoskeletal: No edema, cyanosis, or clubbing. Neuro: Alert, answering all questions appropriately. Cranial nerves grossly intact. Skin:  No rashes or petechiae noted. Psych: Normal affect.    LAB RESULTS:  Appointment on 07/18/2015  Component Date Value Ref Range Status  . WBC 07/18/2015 9.7  3.6 - 11.0 K/uL  Final  . RBC 07/18/2015 4.44  3.80 - 5.20 MIL/uL Final  . Hemoglobin 07/18/2015 14.2  12.0 - 16.0 g/dL Final  . HCT 07/18/2015 41.4  35.0 - 47.0 % Final  . MCV 07/18/2015 93.2  80.0 - 100.0 fL Final  . MCH 07/18/2015 32.1  26.0 - 34.0 pg Final  . MCHC 07/18/2015 34.4  32.0 - 36.0 g/dL Final  . RDW 07/18/2015 12.3  11.5 - 14.5 % Final  . Platelets 07/18/2015 341  150 - 440 K/uL Final  . Neutrophils Relative % 07/18/2015 70   Final  . Neutro Abs 07/18/2015 6.8* 1.4 - 6.5 K/uL Final  . Lymphocytes Relative 07/18/2015 18   Final  . Lymphs Abs 07/18/2015 1.8  1.0 - 3.6 K/uL Final  . Monocytes Relative 07/18/2015 9   Final  . Monocytes Absolute 07/18/2015 0.8  0.2 - 0.9 K/uL Final  . Eosinophils Relative 07/18/2015 2   Final  . Eosinophils Absolute 07/18/2015 0.1  0 - 0.7 K/uL Final  . Basophils Relative 07/18/2015 1   Final  . Basophils Absolute 07/18/2015 0.1  0 - 0.1 K/uL Final    STUDIES: No results found.  ASSESSMENT:  Thrombocytosis.  PLAN:  1. Thrombocytosis. Patient was diagnosed with thrombocytosis around 15 years ago. She has been on hydroxyurea for the past 6-7 years. Patient underwent bone marrow biopsy for the first time in January 2017. Mutational analysis returned negative for [id number] F, calreticulin and MPL mutations. Bone marrow biopsy showed no evidence of myeloid proliferation, myelofibrosis. Mild atypia of megakaryocytes is common in patients, taking hydroxyurea. Although long-term treatment of essential thrombocythemia with hydroxyurea can lead to complete morphologic remission. Patient has discontinued use of hydroxyurea and is currently taking a low-dose 81 mg aspirin daily. Her platelet count has again maintains stability and is within normal limits at 341K. If platelet count starts to increase again, we may consider restarting hydroxyurea, although smoking is a common reason for thrombocytosis, and the patient is a current smoker. Patient expressed understanding and  agreement with this plan.  We will continue with evaluation of laboratory data every 6 weeks and continue with next routine assessment in 18 weeks. Patient expressed understanding and was in agreement with this plan. She also understands that She can call clinic at any time with any questions, concerns, or complaints.   Dr. Rogue Bussing was available for consultation and review of plan of care for this patient.   Evlyn Kanner, NP   07/18/2015 10:29 AM

## 2015-08-09 DIAGNOSIS — Z23 Encounter for immunization: Secondary | ICD-10-CM | POA: Diagnosis not present

## 2015-08-29 ENCOUNTER — Inpatient Hospital Stay: Payer: Medicare HMO | Attending: Family Medicine

## 2015-08-29 DIAGNOSIS — D473 Essential (hemorrhagic) thrombocythemia: Secondary | ICD-10-CM | POA: Insufficient documentation

## 2015-08-29 DIAGNOSIS — D75839 Thrombocytosis, unspecified: Secondary | ICD-10-CM

## 2015-08-29 LAB — CBC WITH DIFFERENTIAL/PLATELET
BASOS PCT: 1 %
Basophils Absolute: 0.1 10*3/uL (ref 0–0.1)
EOS ABS: 0.1 10*3/uL (ref 0–0.7)
EOS PCT: 1 %
HCT: 41 % (ref 35.0–47.0)
Hemoglobin: 14.1 g/dL (ref 12.0–16.0)
Lymphocytes Relative: 20 %
Lymphs Abs: 1.8 10*3/uL (ref 1.0–3.6)
MCH: 30.8 pg (ref 26.0–34.0)
MCHC: 34.4 g/dL (ref 32.0–36.0)
MCV: 89.5 fL (ref 80.0–100.0)
MONO ABS: 0.7 10*3/uL (ref 0.2–0.9)
MONOS PCT: 9 %
Neutro Abs: 6 10*3/uL (ref 1.4–6.5)
Neutrophils Relative %: 69 %
PLATELETS: 340 10*3/uL (ref 150–440)
RBC: 4.58 MIL/uL (ref 3.80–5.20)
RDW: 13.1 % (ref 11.5–14.5)
WBC: 8.8 10*3/uL (ref 3.6–11.0)

## 2015-10-10 ENCOUNTER — Inpatient Hospital Stay: Payer: Medicare HMO | Attending: Internal Medicine

## 2015-10-10 DIAGNOSIS — D75839 Thrombocytosis, unspecified: Secondary | ICD-10-CM

## 2015-10-10 DIAGNOSIS — D473 Essential (hemorrhagic) thrombocythemia: Secondary | ICD-10-CM | POA: Insufficient documentation

## 2015-10-10 LAB — CBC WITH DIFFERENTIAL/PLATELET
Basophils Absolute: 0.1 10*3/uL (ref 0–0.1)
Basophils Relative: 1 %
EOS ABS: 0.2 10*3/uL (ref 0–0.7)
EOS PCT: 2 %
HCT: 41.5 % (ref 35.0–47.0)
Hemoglobin: 14.2 g/dL (ref 12.0–16.0)
LYMPHS ABS: 2.1 10*3/uL (ref 1.0–3.6)
Lymphocytes Relative: 21 %
MCH: 30.7 pg (ref 26.0–34.0)
MCHC: 34.3 g/dL (ref 32.0–36.0)
MCV: 89.4 fL (ref 80.0–100.0)
MONO ABS: 0.9 10*3/uL (ref 0.2–0.9)
Monocytes Relative: 9 %
Neutro Abs: 6.8 10*3/uL — ABNORMAL HIGH (ref 1.4–6.5)
Neutrophils Relative %: 67 %
PLATELETS: 353 10*3/uL (ref 150–440)
RBC: 4.64 MIL/uL (ref 3.80–5.20)
RDW: 13.7 % (ref 11.5–14.5)
WBC: 10.1 10*3/uL (ref 3.6–11.0)

## 2015-11-20 NOTE — Progress Notes (Signed)
[place]  Telephone:(336) [phone]  Fax:(336) [place] DOB: 1945/06/01  MR#: [id number]  NLZ#:[id number]  Patient Care Team: No Pcp Per Patient as PCP - General (General Practice)  CHIEF COMPLAINT:  Thrombocytosis.  INTERVAL HISTORY: Patient returns to clinic today for repeat laboratory work and routine six-month follow-up. She continues to feel well and remains asymptomatic. She has no neurologic complaints. She denies any recent fevers or illnesses. She has good appetite and denies weight loss. She has no chest pain or shortness of breath. She denies any nausea, vomiting, constipation, or diarrhea. She has no urinary complaints. Patient feels at her baseline and offers no specific complaints today.   REVIEW OF SYSTEMS:   Review of Systems  Constitutional: Negative.  Negative for fever, malaise/fatigue and weight loss.  Respiratory: Negative.  Negative for cough and shortness of breath.   Cardiovascular: Negative.  Negative for chest pain and leg swelling.  Gastrointestinal: Negative.  Negative for abdominal pain.  Genitourinary: Negative.   Musculoskeletal: Negative.   Neurological: Negative.  Negative for weakness.  Psychiatric/Behavioral: Negative.  The patient is not nervous/anxious.     As per HPI. Otherwise, a complete review of systems is negatve.   PAST MEDICAL HISTORY: Past Medical History:  Diagnosis Date  . Blood dyscrasia    thrombocytopenia  . Encounter for screening mammogram for breast cancer 10/06/2014  . Thrombocytosis ([place]) 01/29/2015    PAST SURGICAL HISTORY: Past Surgical History:  Procedure Laterality Date  . BREAST BIOPSY Right 1979, 1973   neg    FAMILY HISTORY Family History  Problem Relation Age of Onset  . Breast cancer Sister 68    GYNECOLOGIC HISTORY:  No LMP recorded. Patient is postmenopausal.     ADVANCED DIRECTIVES:    HEALTH MAINTENANCE: Social History  Substance Use Topics  . Smoking  status: Current Every Day Smoker    Packs/day: 1.00    Years: 56.00  . Smokeless tobacco: Never Used  . Alcohol use Yes     Comment: occasioanal     Mammogram:November 2016  Allergies  Allergen Reactions  . Penicillins Rash    Current Outpatient Prescriptions  Medication Sig Dispense Refill  . aspirin 81 MG tablet Take 81 mg by mouth daily.    Marland Kitchen b complex vitamins tablet Take 1 tablet by mouth daily.     No current facility-administered medications for this visit.     OBJECTIVE: BP (!) 148/87 (BP Location: Left Arm, Patient Position: Sitting)   Pulse 80   Temp (!) 95.9 F (35.5 C) (Tympanic)   Resp 18   Wt 110 lb 10.7 oz (50.2 kg)   BMI 18.42 kg/m    Body mass index is 18.42 kg/m.    ECOG FS:0 - Asymptomatic  General: Well-developed, well-nourished, no acute distress. Eyes: Pink conjunctiva, anicteric sclera. HEENT: Normocephalic, moist mucous membranes, clear oropharnyx. Lungs: Clear to auscultation bilaterally. Heart: Regular rate and rhythm. No rubs, murmurs, or gallops. Musculoskeletal: No edema, cyanosis, or clubbing. Neuro: Alert, answering all questions appropriately. Cranial nerves grossly intact. Skin: No rashes or petechiae noted. Psych: Normal affect.    LAB RESULTS:  Appointment on 11/21/2015  Component Date Value Ref Range Status  . WBC 11/21/2015 8.9  3.6 - 11.0 K/uL Final  . RBC 11/21/2015 4.47  3.80 - 5.20 MIL/uL Final  . Hemoglobin 11/21/2015 13.8  12.0 - 16.0 g/dL Final  . HCT 11/21/2015 40.0  35.0 - 47.0 % Final  . MCV 11/21/2015  89.5  80.0 - 100.0 fL Final  . MCH 11/21/2015 30.8  26.0 - 34.0 pg Final  . MCHC 11/21/2015 34.4  32.0 - 36.0 g/dL Final  . RDW 11/21/2015 13.8  11.5 - 14.5 % Final  . Platelets 11/21/2015 335  150 - 440 K/uL Final  . Neutrophils Relative % 11/21/2015 67  % Final  . Neutro Abs 11/21/2015 6.0  1.4 - 6.5 K/uL Final  . Lymphocytes Relative 11/21/2015 22  % Final  . Lymphs Abs 11/21/2015 1.9  1.0 - 3.6 K/uL Final    . Monocytes Relative 11/21/2015 9  % Final  . Monocytes Absolute 11/21/2015 0.8  0.2 - 0.9 K/uL Final  . Eosinophils Relative 11/21/2015 2  % Final  . Eosinophils Absolute 11/21/2015 0.2  0 - 0.7 K/uL Final  . Basophils Relative 11/21/2015 0  % Final  . Basophils Absolute 11/21/2015 0.0  0 - 0.1 K/uL Final    STUDIES: No results found.  ASSESSMENT:  Thrombocytosis.  PLAN:  1. Thrombocytosis: Patient's platelet count continues to be within normal limits. Currently she is taking 81 mg of aspirin only. She was on Hydrea for approximately 6 or 7 years but this was discontinued this past January with no appreciable change of her platelet count. Prior to that, she was placed on anagrelide but could not tolerate it  Secondary to GI complaints. Patient underwent bone marrow biopsy for the first time in January 2017. Mutational analysis returned negative for [id number] F, calreticulin and MPL mutations. There was no evidence of myeloid proliferation, myelofibrosis. She had mild atypia of megakaryocytes is common in patients, taking hydroxyurea. No intervention is needed at this time.  Return to clinic in 6 months.  If her platelets continue to be within normal limits, she likely can be discharged from clinic. 2. Health Maintenance: Patient was encourage to obtain a primary care physician.   Patient expressed understanding and was in agreement with this plan. She also understands that She can call clinic at any time with any questions, concerns, or complaints.     Lloyd Huger, MD   11/23/2015 11:19 AM

## 2015-11-21 ENCOUNTER — Inpatient Hospital Stay: Payer: Medicare HMO | Attending: Internal Medicine

## 2015-11-21 ENCOUNTER — Ambulatory Visit: Payer: Medicare HMO | Admitting: Oncology

## 2015-11-21 ENCOUNTER — Inpatient Hospital Stay (HOSPITAL_BASED_OUTPATIENT_CLINIC_OR_DEPARTMENT_OTHER): Payer: Medicare HMO | Admitting: Oncology

## 2015-11-21 VITALS — BP 148/87 | HR 80 | Temp 95.9°F | Resp 18 | Wt 110.7 lb

## 2015-11-21 DIAGNOSIS — D473 Essential (hemorrhagic) thrombocythemia: Secondary | ICD-10-CM | POA: Insufficient documentation

## 2015-11-21 DIAGNOSIS — Z7982 Long term (current) use of aspirin: Secondary | ICD-10-CM | POA: Diagnosis not present

## 2015-11-21 DIAGNOSIS — F17210 Nicotine dependence, cigarettes, uncomplicated: Secondary | ICD-10-CM | POA: Diagnosis not present

## 2015-11-21 DIAGNOSIS — D75839 Thrombocytosis, unspecified: Secondary | ICD-10-CM

## 2015-11-21 DIAGNOSIS — R69 Illness, unspecified: Secondary | ICD-10-CM | POA: Diagnosis not present

## 2015-11-21 LAB — CBC WITH DIFFERENTIAL/PLATELET
BASOS ABS: 0 10*3/uL (ref 0–0.1)
BASOS PCT: 0 %
Eosinophils Absolute: 0.2 10*3/uL (ref 0–0.7)
Eosinophils Relative: 2 %
HEMATOCRIT: 40 % (ref 35.0–47.0)
HEMOGLOBIN: 13.8 g/dL (ref 12.0–16.0)
LYMPHS PCT: 22 %
Lymphs Abs: 1.9 10*3/uL (ref 1.0–3.6)
MCH: 30.8 pg (ref 26.0–34.0)
MCHC: 34.4 g/dL (ref 32.0–36.0)
MCV: 89.5 fL (ref 80.0–100.0)
Monocytes Absolute: 0.8 10*3/uL (ref 0.2–0.9)
Monocytes Relative: 9 %
NEUTROS ABS: 6 10*3/uL (ref 1.4–6.5)
NEUTROS PCT: 67 %
Platelets: 335 10*3/uL (ref 150–440)
RBC: 4.47 MIL/uL (ref 3.80–5.20)
RDW: 13.8 % (ref 11.5–14.5)
WBC: 8.9 10*3/uL (ref 3.6–11.0)

## 2015-11-21 NOTE — Progress Notes (Signed)
Feeling well. Offers no complaints. 

## 2016-02-13 ENCOUNTER — Ambulatory Visit
Admission: RE | Admit: 2016-02-13 | Discharge: 2016-02-13 | Disposition: A | Discharge status: Home / Self Care | Payer: Medicare HMO | Source: Ambulatory Visit | Attending: Family Medicine | Admitting: Family Medicine

## 2016-02-13 DIAGNOSIS — Z1231 Encounter for screening mammogram for malignant neoplasm of breast: Secondary | ICD-10-CM | POA: Diagnosis not present

## 2016-05-20 NOTE — Progress Notes (Signed)
[place]  Telephone:(336) [phone]  Fax:(336) [place] DOB: Sep 20, 1945  MR#: [id number]  YFV#:[id number]  Patient Care Team: No Pcp Per Patient as PCP - General (General Practice)  CHIEF COMPLAINT:  Thrombocytosis.  INTERVAL HISTORY: Patient returns to clinic today for repeat laboratory work and routine six-month follow-up. She continues to feel well and remains asymptomatic. She has no neurologic complaints. She denies any recent fevers or illnesses. She has a good appetite and denies weight loss. She has no chest pain or shortness of breath. She denies any nausea, vomiting, constipation, or diarrhea. She has no urinary complaints. Patient feels at her baseline and offers no specific complaints today.   REVIEW OF SYSTEMS:   Review of Systems  Constitutional: Negative.  Negative for fever, malaise/fatigue and weight loss.  Respiratory: Negative.  Negative for cough and shortness of breath.   Cardiovascular: Negative.  Negative for chest pain and leg swelling.  Gastrointestinal: Negative.  Negative for abdominal pain.  Genitourinary: Negative.   Musculoskeletal: Negative.   Neurological: Negative.  Negative for weakness.  Psychiatric/Behavioral: Negative.  The patient is not nervous/anxious.     As per HPI. Otherwise, a complete review of systems is negative.   PAST MEDICAL HISTORY: Past Medical History:  Diagnosis Date  . Blood dyscrasia    thrombocytopenia  . Encounter for screening mammogram for breast cancer 10/06/2014  . Thrombocytosis ([place]) 01/29/2015    PAST SURGICAL HISTORY: Past Surgical History:  Procedure Laterality Date  . BREAST EXCISIONAL BIOPSY Right 1979, 1973   neg    FAMILY HISTORY Family History  Problem Relation Age of Onset  . Breast cancer Sister 70    GYNECOLOGIC HISTORY:  No LMP recorded. Patient is postmenopausal.     ADVANCED DIRECTIVES:    HEALTH MAINTENANCE: Social History  Substance Use Topics    . Smoking status: Current Every Day Smoker    Packs/day: 1.00    Years: 56.00  . Smokeless tobacco: Never Used  . Alcohol use Yes     Comment: occasioanal     Mammogram:November 2016  Allergies  Allergen Reactions  . Penicillins Rash    Current Outpatient Prescriptions  Medication Sig Dispense Refill  . aspirin 81 MG tablet Take 81 mg by mouth daily.    Marland Kitchen b complex vitamins tablet Take 1 tablet by mouth daily.     No current facility-administered medications for this visit.     OBJECTIVE: BP (!) 146/83 (BP Location: Left Arm, Patient Position: Sitting)   Pulse 90   Temp (!) 96.3 F (35.7 C) (Tympanic)   Resp 18   Wt 117 lb 9.9 oz (53.4 kg)   BMI 19.57 kg/m    Body mass index is 19.57 kg/m.    ECOG FS:0 - Asymptomatic  General: Well-developed, well-nourished, no acute distress. Eyes: Pink conjunctiva, anicteric sclera. HEENT: Normocephalic, moist mucous membranes, clear oropharnyx. Lungs: Clear to auscultation bilaterally. Heart: Regular rate and rhythm. No rubs, murmurs, or gallops. Musculoskeletal: No edema, cyanosis, or clubbing. Neuro: Alert, answering all questions appropriately. Cranial nerves grossly intact. Skin: No rashes or petechiae noted. Psych: Normal affect.    LAB RESULTS:  Appointment on 05/21/2016  Component Date Value Ref Range Status  . WBC 05/21/2016 9.7  3.6 - 11.0 K/uL Final  . RBC 05/21/2016 4.50  3.80 - 5.20 MIL/uL Final  . Hemoglobin 05/21/2016 13.8  12.0 - 16.0 g/dL Final  . HCT 05/21/2016 40.3  35.0 - 47.0 % Final  .  MCV 05/21/2016 89.6  80.0 - 100.0 fL Final  . MCH 05/21/2016 30.8  26.0 - 34.0 pg Final  . MCHC 05/21/2016 34.3  32.0 - 36.0 g/dL Final  . RDW 05/21/2016 14.1  11.5 - 14.5 % Final  . Platelets 05/21/2016 406  150 - 440 K/uL Final  . Neutrophils Relative % 05/21/2016 67  % Final  . Neutro Abs 05/21/2016 6.5  1.4 - 6.5 K/uL Final  . Lymphocytes Relative 05/21/2016 22  % Final  . Lymphs Abs 05/21/2016 2.1  1.0 - 3.6  K/uL Final  . Monocytes Relative 05/21/2016 8  % Final  . Monocytes Absolute 05/21/2016 0.8  0.2 - 0.9 K/uL Final  . Eosinophils Relative 05/21/2016 2  % Final  . Eosinophils Absolute 05/21/2016 0.2  0 - 0.7 K/uL Final  . Basophils Relative 05/21/2016 1  % Final  . Basophils Absolute 05/21/2016 0.1  0 - 0.1 K/uL Final    STUDIES: No results found.  ASSESSMENT:  Thrombocytosis.  PLAN:  1. Thrombocytosis: Patient's platelet count continues to be within normal limits. Currently she is taking 81 mg of aspirin only. She was on Hydrea for approximately 6 or 7 years but this was discontinued in January 2017 with no appreciable change of her platelet count. Prior to that, she was placed on anagrelide but could not tolerate it secondary to GI complaints. Patient underwent bone marrow biopsy for the first time in January 2017. Mutational analysis returned negative for Jak2V617F, calreticulin and MPL mutations. There was no evidence of myeloid proliferation, myelofibrosis. She had mild atypia of megakaryocytes is common in patients, taking hydroxyurea. No intervention is needed at this time. Have recommended that patient be discharged from clinic, but she requests and interested in coming back in one year for further evaluation.  2. Health Maintenance: Patient was encourage to obtain a primary care physician.   Patient expressed understanding and was in agreement with this plan. She also understands that She can call clinic at any time with any questions, concerns, or complaints.     Lloyd Huger, MD   05/25/2016 9:48 AM

## 2016-05-21 ENCOUNTER — Inpatient Hospital Stay (HOSPITAL_BASED_OUTPATIENT_CLINIC_OR_DEPARTMENT_OTHER): Payer: Medicare HMO | Admitting: Oncology

## 2016-05-21 ENCOUNTER — Inpatient Hospital Stay: Payer: Medicare HMO | Attending: Oncology

## 2016-05-21 VITALS — BP 146/83 | HR 90 | Temp 96.3°F | Resp 18 | Wt 117.6 lb

## 2016-05-21 DIAGNOSIS — Z7982 Long term (current) use of aspirin: Secondary | ICD-10-CM | POA: Diagnosis not present

## 2016-05-21 DIAGNOSIS — Z79899 Other long term (current) drug therapy: Secondary | ICD-10-CM | POA: Diagnosis not present

## 2016-05-21 DIAGNOSIS — Z780 Asymptomatic menopausal state: Secondary | ICD-10-CM

## 2016-05-21 DIAGNOSIS — D473 Essential (hemorrhagic) thrombocythemia: Secondary | ICD-10-CM

## 2016-05-21 DIAGNOSIS — Z880 Allergy status to penicillin: Secondary | ICD-10-CM

## 2016-05-21 DIAGNOSIS — Z803 Family history of malignant neoplasm of breast: Secondary | ICD-10-CM

## 2016-05-21 DIAGNOSIS — R69 Illness, unspecified: Secondary | ICD-10-CM | POA: Diagnosis not present

## 2016-05-21 DIAGNOSIS — F17210 Nicotine dependence, cigarettes, uncomplicated: Secondary | ICD-10-CM

## 2016-05-21 DIAGNOSIS — D75839 Thrombocytosis, unspecified: Secondary | ICD-10-CM

## 2016-05-21 LAB — CBC WITH DIFFERENTIAL/PLATELET
Basophils Absolute: 0.1 10*3/uL (ref 0–0.1)
Basophils Relative: 1 %
Eosinophils Absolute: 0.2 10*3/uL (ref 0–0.7)
Eosinophils Relative: 2 %
HCT: 40.3 % (ref 35.0–47.0)
HEMOGLOBIN: 13.8 g/dL (ref 12.0–16.0)
LYMPHS ABS: 2.1 10*3/uL (ref 1.0–3.6)
LYMPHS PCT: 22 %
MCH: 30.8 pg (ref 26.0–34.0)
MCHC: 34.3 g/dL (ref 32.0–36.0)
MCV: 89.6 fL (ref 80.0–100.0)
Monocytes Absolute: 0.8 10*3/uL (ref 0.2–0.9)
Monocytes Relative: 8 %
NEUTROS PCT: 67 %
Neutro Abs: 6.5 10*3/uL (ref 1.4–6.5)
Platelets: 406 10*3/uL (ref 150–440)
RBC: 4.5 MIL/uL (ref 3.80–5.20)
RDW: 14.1 % (ref 11.5–14.5)
WBC: 9.7 10*3/uL (ref 3.6–11.0)

## 2016-05-21 NOTE — Progress Notes (Signed)
Offers no complaints. Feeling well today.

## 2016-07-15 DIAGNOSIS — H2513 Age-related nuclear cataract, bilateral: Secondary | ICD-10-CM | POA: Diagnosis not present

## 2016-11-05 IMAGING — CT CT BIOPSY
2 series · 13 of 32 positions shown, 19 images · non-contrast
Comparison: none

CLINICAL DATA: Thrombocytosis

[Series 2: routine osteo · axial · 0.67mm/px · z∈[-241,-171]mm · 4 of 43 slices shown]
[im 5/43  soft-tissue]
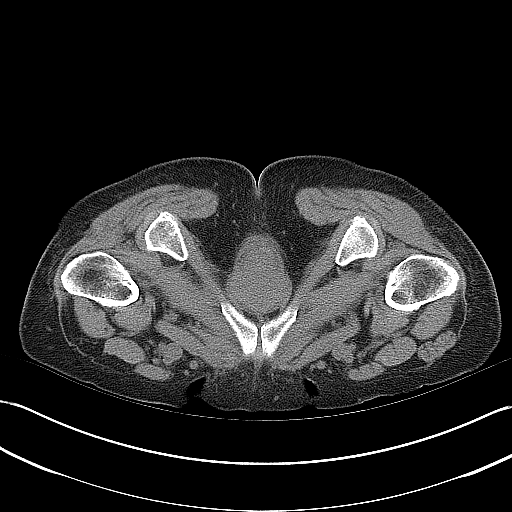
[im 9/43  soft-tissue]
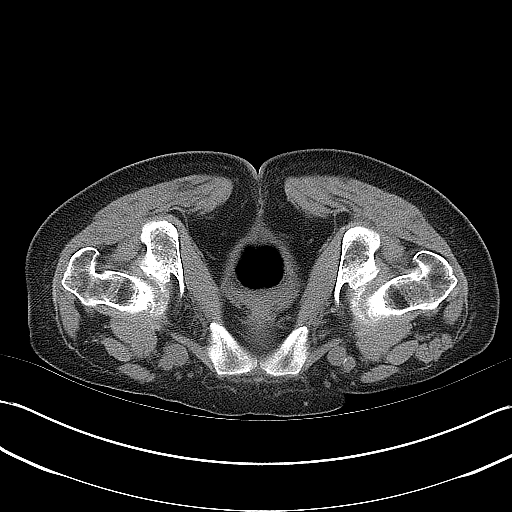
[im 13/43  soft-tissue]
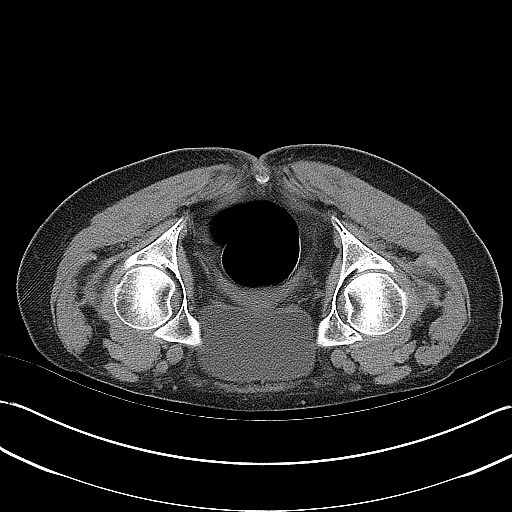
[im 19/43  soft-tissue]
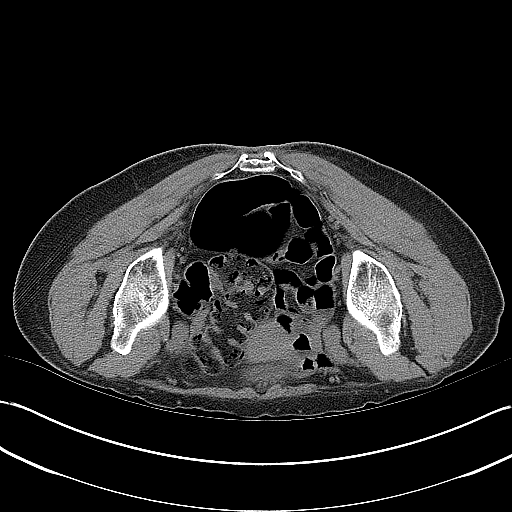

[Series 3: osteo bx 2.4 b70s · axial · 0.65mm/px · z∈[-106,-95]mm · 9 of 24 slices shown, 15 images]
[im 3/24  soft-tissue]
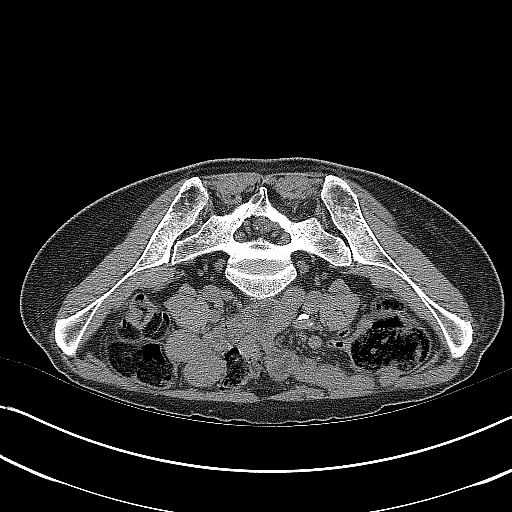
[im 3/24  bone]
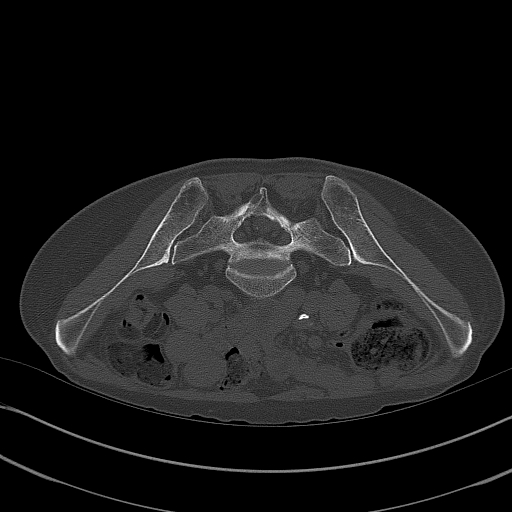
[im 5/24  soft-tissue]
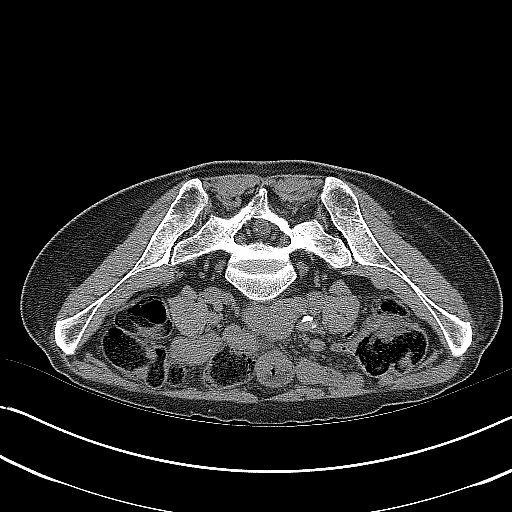
[im 7/24  soft-tissue]
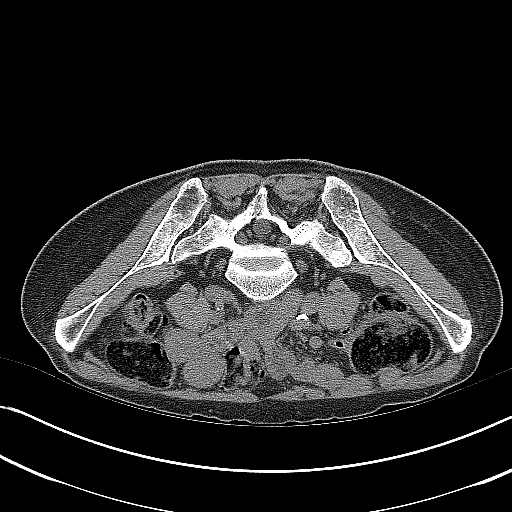
[im 10/24  soft-tissue]
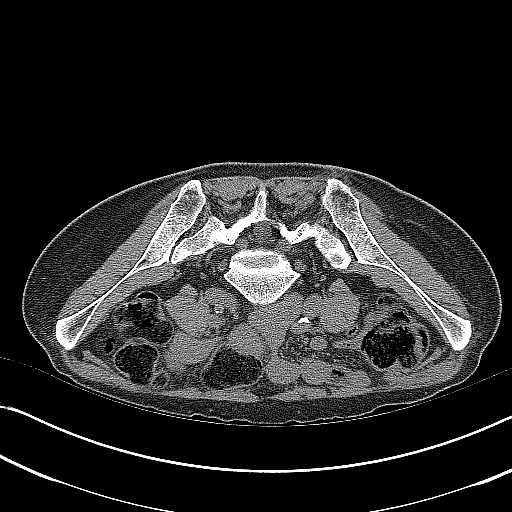
[im 12/24  soft-tissue]
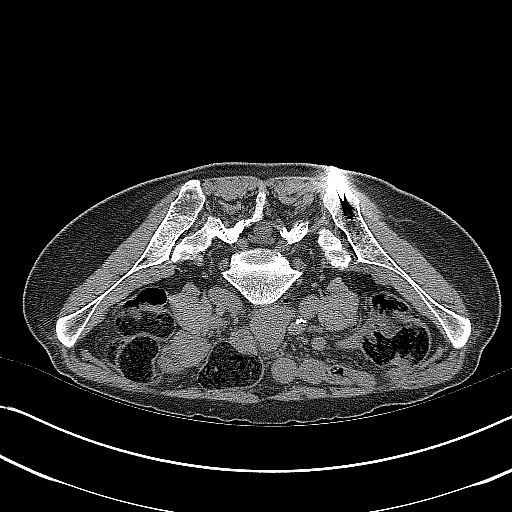
[im 14/24  soft-tissue]
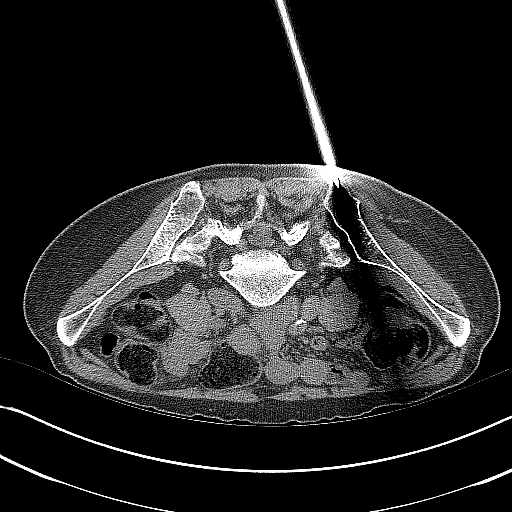
[im 14/24  lung]
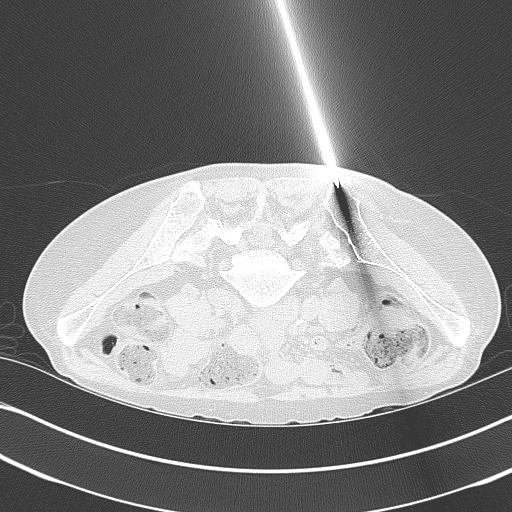
[im 17/24  soft-tissue]
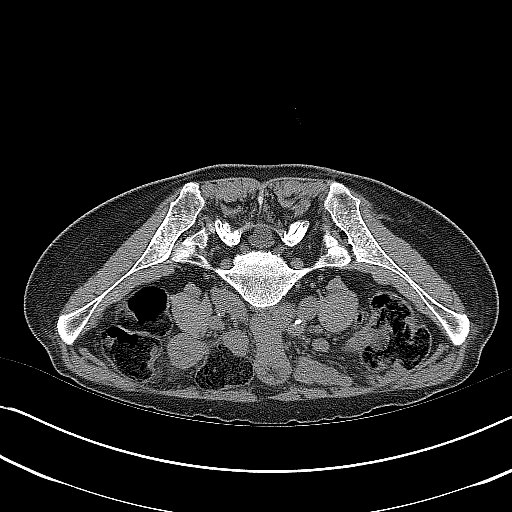
[im 17/24  lung]
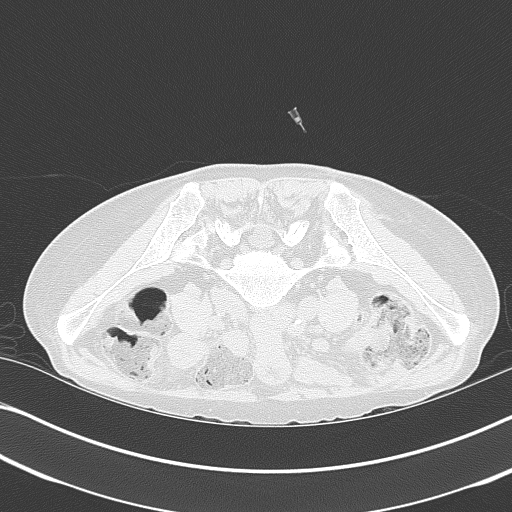
[im 19/24  soft-tissue]
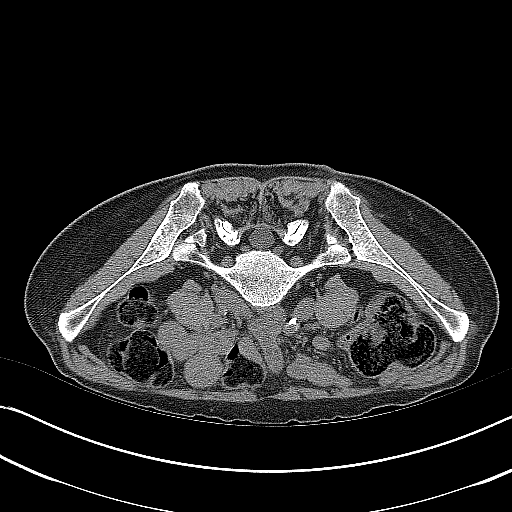
[im 19/24  lung]
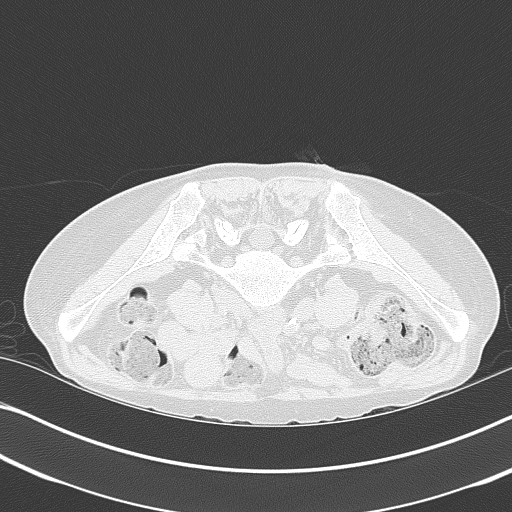
[im 21/24  soft-tissue]
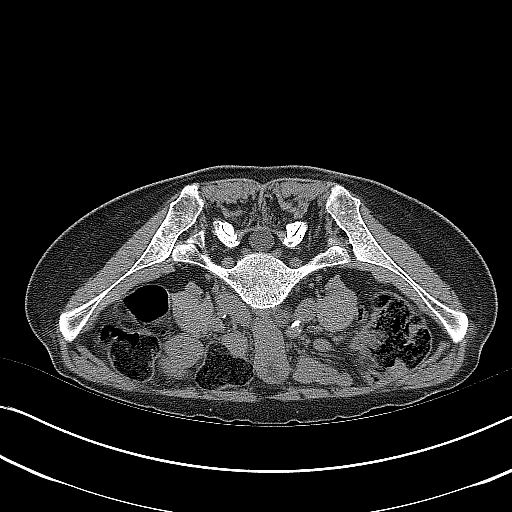
[im 21/24  lung]
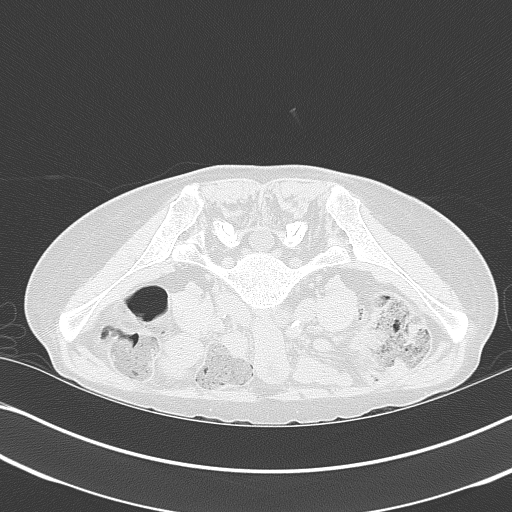
[im 21/24  bone]
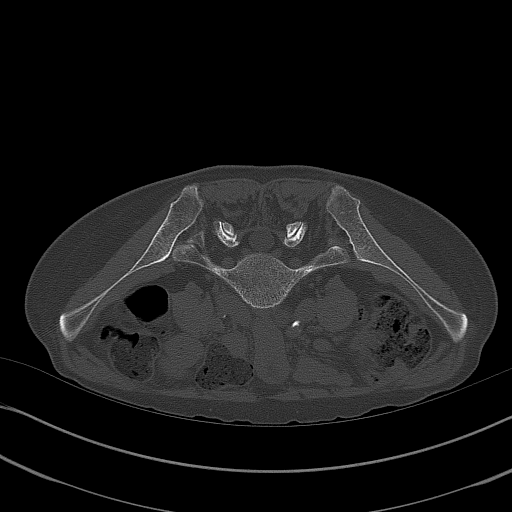

[13 of 32 positions shown; findings below may reference images not displayed]

EXAM:
CT-GUIDED BIOPSY BONE MARROW ASPIRATE AND CORE.

MEDICATIONS AND MEDICAL HISTORY:
Versed 2 mg, Fentanyl 50 mcg.

Additional Medications: None.

ANESTHESIA/SEDATION:
Moderate sedation time: 13 minutes

PROCEDURE:
The procedure, risks, benefits, and alternatives were explained to
the patient. Questions regarding the procedure were encouraged and
answered. The patient understands and consents to the procedure.

The back was prepped with ChloraPrep in a sterile fashion, and a
sterile drape was applied covering the operative field. A sterile
gown and sterile gloves were used for the procedure.

Under CT guidance, an 11 gauge needle was inserted into the right
iliac bone via posterior approach. Aspirates and a core were obtain.
Final imaging was performed.

Patient tolerated the procedure well without complication. Vital
sign monitoring by nursing staff during the procedure will continue
as patient is in the special procedures unit for post procedure
observation.
FINDINGS: The images document guide needle placement within the right iliac
bone. Post biopsy images demonstrate no hemorrhage.

COMPLICATIONS:
None
IMPRESSION: Successful CT-guided bone marrow aspirate and core.

## 2016-12-31 ENCOUNTER — Other Ambulatory Visit: Payer: Self-pay | Admitting: Oncology

## 2016-12-31 DIAGNOSIS — Z1231 Encounter for screening mammogram for malignant neoplasm of breast: Secondary | ICD-10-CM

## 2017-02-17 ENCOUNTER — Ambulatory Visit
Admission: RE | Admit: 2017-02-17 | Discharge: 2017-02-17 | Disposition: A | Discharge status: Home / Self Care | Payer: Medicare HMO | Source: Ambulatory Visit | Attending: Oncology | Admitting: Oncology

## 2017-02-17 DIAGNOSIS — Z1231 Encounter for screening mammogram for malignant neoplasm of breast: Secondary | ICD-10-CM | POA: Diagnosis not present

## 2017-05-16 NOTE — Progress Notes (Signed)
[facility]  [phone]  Fax:([phone]) [place] DOB: 1945-05-01  MR#: [id number]  ZLD#:[id number]  Patient Care Team: Patient, No Pcp Per as PCP - General (General Practice)  CHIEF COMPLAINT:  Thrombocytosis.  INTERVAL HISTORY: Patient returns to clinic today for repeat laboratory work and yearly follow-up. She continues to feel well and remains asymptomatic. She has no neurologic complaints. She denies any recent fevers or illnesses. She has a good appetite and denies weight loss. She has no chest pain or shortness of breath. She denies any nausea, vomiting, constipation, or diarrhea. She has no urinary complaints. Patient feels at her baseline and offers no specific complaints today.   REVIEW OF SYSTEMS:   Review of Systems  Constitutional: Negative.  Negative for fever, malaise/fatigue and weight loss.  Respiratory: Negative.  Negative for cough and shortness of breath.   Cardiovascular: Negative.  Negative for chest pain and leg swelling.  Gastrointestinal: Negative.  Negative for abdominal pain, blood in stool and melena.  Genitourinary: Negative.  Negative for dysuria.  Musculoskeletal: Negative.   Skin: Negative.  Negative for rash.  Neurological: Negative.  Negative for sensory change and weakness.  Endo/Heme/Allergies: Does not bruise/bleed easily.  Psychiatric/Behavioral: Negative.  The patient is not nervous/anxious.     As per HPI. Otherwise, a complete review of systems is negative.   PAST MEDICAL HISTORY: Past Medical History:  Diagnosis Date  . Blood dyscrasia    thrombocytopenia  . Encounter for screening mammogram for breast cancer 10/06/2014  . Thrombocytosis ([place]) 01/29/2015    PAST SURGICAL HISTORY: Past Surgical History:  Procedure Laterality Date  . BREAST EXCISIONAL BIOPSY Right 1979, 1973   neg    FAMILY HISTORY Family History  Problem Relation Age of Onset  . Breast cancer Sister 30    GYNECOLOGIC  HISTORY:  No LMP recorded. Patient is postmenopausal.     ADVANCED DIRECTIVES:    HEALTH MAINTENANCE: Social History   Tobacco Use  . Smoking status: Current Every Day Smoker    Packs/day: 1.00    Years: 56.00    Pack years: 56.00  . Smokeless tobacco: Never Used  Substance Use Topics  . Alcohol use: Yes    Comment: occasioanal  . Drug use: No     Mammogram:November 2016  Allergies  Allergen Reactions  . Penicillins Rash    Current Outpatient Medications  Medication Sig Dispense Refill  . aspirin 81 MG tablet Take 81 mg by mouth daily.    Marland Kitchen b complex vitamins tablet Take 1 tablet by mouth daily.     No current facility-administered medications for this visit.     OBJECTIVE: BP (!) 147/79 (BP Location: Right Arm, Patient Position: Sitting)   Pulse 88   Temp 97.8 F (36.6 C)   Resp 16   Ht 5' 7"  (1.702 m)   Wt 111 lb 11.2 oz (50.7 kg)   SpO2 100%   BMI 17.49 kg/m    Body mass index is 17.49 kg/m.    ECOG FS:0 - Asymptomatic  General: Well-developed, well-nourished, no acute distress. Eyes: Pink conjunctiva, anicteric sclera. HEENT: Normocephalic, moist mucous membranes, clear oropharnyx. Lungs: Clear to auscultation bilaterally. Heart: Regular rate and rhythm. No rubs, murmurs, or gallops. Musculoskeletal: No edema, cyanosis, or clubbing. Neuro: Alert, answering all questions appropriately. Cranial nerves grossly intact. Skin: No rashes or petechiae noted. Psych: Normal affect.    LAB RESULTS:  Appointment on 05/21/2017  Component Date Value Ref Range Status  .  WBC 05/21/2017 8.8  3.6 - 11.0 K/uL Final  . RBC 05/21/2017 4.40  3.80 - 5.20 MIL/uL Final  . Hemoglobin 05/21/2017 13.6  12.0 - 16.0 g/dL Final  . HCT 05/21/2017 40.1  35.0 - 47.0 % Final  . MCV 05/21/2017 91.2  80.0 - 100.0 fL Final  . MCH 05/21/2017 30.9  26.0 - 34.0 pg Final  . MCHC 05/21/2017 33.8  32.0 - 36.0 g/dL Final  . RDW 05/21/2017 14.0  11.5 - 14.5 % Final  . Platelets  05/21/2017 398  150 - 440 K/uL Final  . Neutrophils Relative % 05/21/2017 73  % Final  . Neutro Abs 05/21/2017 6.5  1.4 - 6.5 K/uL Final  . Lymphocytes Relative 05/21/2017 18  % Final  . Lymphs Abs 05/21/2017 1.6  1.0 - 3.6 K/uL Final  . Monocytes Relative 05/21/2017 7  % Final  . Monocytes Absolute 05/21/2017 0.6  0.2 - 0.9 K/uL Final  . Eosinophils Relative 05/21/2017 2  % Final  . Eosinophils Absolute 05/21/2017 0.1  0 - 0.7 K/uL Final  . Basophils Relative 05/21/2017 0  % Final  . Basophils Absolute 05/21/2017 0.0  0 - 0.1 K/uL Final   Performed at [facility], [address] [place], [place] [address]    STUDIES: No results found.  ASSESSMENT:  Thrombocytosis.  PLAN:  1. Thrombocytosis: Patient's platelet count continues to be within normal limits. Currently she is taking 81 mg of aspirin only. She was on Hydrea for approximately 6 or 7 years but this was discontinued in January 2017 with no appreciable change of her platelet count. Prior to that, she was placed on anagrelide but could not tolerate it secondary to GI complaints. Patient underwent bone marrow biopsy for the first time in January 2017. Mutational analysis returned negative for [id number], calreticulin and MPL mutations. There was no evidence of myeloid proliferation, myelofibrosis. She had mild atypia of megakaryocytes is common in patients, taking hydroxyurea. No intervention is needed at this time.  After lengthy discussion with the patient, she has agreed that no further follow-up is necessary.  Please refer patient back if there are any questions or concerns.    2. Health Maintenance: Patient was encourage to obtain a primary care physician.  Approximately 20 minutes was spent in discussion of which greater than 50% was consultation.  Patient expressed understanding and was in agreement with this plan. She also understands that She can call clinic at any time with any questions, concerns, or complaints.      Lloyd Huger, MD   05/23/2017 8:18 AM

## 2017-05-21 ENCOUNTER — Inpatient Hospital Stay: Payer: Medicare HMO | Attending: Oncology

## 2017-05-21 ENCOUNTER — Other Ambulatory Visit: Payer: Self-pay

## 2017-05-21 ENCOUNTER — Inpatient Hospital Stay (HOSPITAL_BASED_OUTPATIENT_CLINIC_OR_DEPARTMENT_OTHER): Payer: Medicare HMO | Admitting: Oncology

## 2017-05-21 ENCOUNTER — Encounter: Payer: Self-pay | Admitting: Oncology

## 2017-05-21 VITALS — BP 147/79 | HR 88 | Temp 97.8°F | Resp 16 | Ht 67.0 in | Wt 111.7 lb

## 2017-05-21 DIAGNOSIS — Z7982 Long term (current) use of aspirin: Secondary | ICD-10-CM

## 2017-05-21 DIAGNOSIS — D75839 Thrombocytosis, unspecified: Secondary | ICD-10-CM

## 2017-05-21 DIAGNOSIS — Z79899 Other long term (current) drug therapy: Secondary | ICD-10-CM | POA: Diagnosis not present

## 2017-05-21 DIAGNOSIS — F17210 Nicotine dependence, cigarettes, uncomplicated: Secondary | ICD-10-CM | POA: Insufficient documentation

## 2017-05-21 DIAGNOSIS — Z803 Family history of malignant neoplasm of breast: Secondary | ICD-10-CM

## 2017-05-21 DIAGNOSIS — D473 Essential (hemorrhagic) thrombocythemia: Secondary | ICD-10-CM

## 2017-05-21 DIAGNOSIS — Z880 Allergy status to penicillin: Secondary | ICD-10-CM

## 2017-05-21 DIAGNOSIS — R69 Illness, unspecified: Secondary | ICD-10-CM | POA: Diagnosis not present

## 2017-05-21 LAB — CBC WITH DIFFERENTIAL/PLATELET
Basophils Absolute: 0 10*3/uL (ref 0–0.1)
Basophils Relative: 0 %
Eosinophils Absolute: 0.1 10*3/uL (ref 0–0.7)
Eosinophils Relative: 2 %
HCT: 40.1 % (ref 35.0–47.0)
Hemoglobin: 13.6 g/dL (ref 12.0–16.0)
Lymphocytes Relative: 18 %
Lymphs Abs: 1.6 10*3/uL (ref 1.0–3.6)
MCH: 30.9 pg (ref 26.0–34.0)
MCHC: 33.8 g/dL (ref 32.0–36.0)
MCV: 91.2 fL (ref 80.0–100.0)
Monocytes Absolute: 0.6 10*3/uL (ref 0.2–0.9)
Monocytes Relative: 7 %
Neutro Abs: 6.5 10*3/uL (ref 1.4–6.5)
Neutrophils Relative %: 73 %
Platelets: 398 10*3/uL (ref 150–440)
RBC: 4.4 MIL/uL (ref 3.80–5.20)
RDW: 14 % (ref 11.5–14.5)
WBC: 8.8 10*3/uL (ref 3.6–11.0)

## 2017-05-21 NOTE — Progress Notes (Signed)
Patient here for follow up she has lost 6 pounds since stopping hydrea.
